# Patient Record
Sex: Female | Born: 1950 | Race: White | Hispanic: No | Smoking: Former smoker
Health system: Southern US, Community
[De-identification: ages and names within clinical notes are randomized; demographics above are authoritative.]

## PROBLEM LIST (undated history)

## (undated) DIAGNOSIS — I10 Essential (primary) hypertension: Secondary | ICD-10-CM

## (undated) DIAGNOSIS — D219 Benign neoplasm of connective and other soft tissue, unspecified: Secondary | ICD-10-CM

## (undated) DIAGNOSIS — R918 Other nonspecific abnormal finding of lung field: Secondary | ICD-10-CM

## (undated) DIAGNOSIS — C801 Malignant (primary) neoplasm, unspecified: Secondary | ICD-10-CM

## (undated) HISTORY — DX: Benign neoplasm of connective and other soft tissue, unspecified: D21.9

## (undated) HISTORY — DX: Essential (primary) hypertension: I10

## (undated) HISTORY — DX: Other nonspecific abnormal finding of lung field: R91.8

## (undated) HISTORY — PX: CARDIAC SURGERY: SHX584

## (undated) HISTORY — PX: CHOLECYSTECTOMY: SHX55

---

## 2001-04-14 ENCOUNTER — Emergency Department (HOSPITAL_COMMUNITY): Admission: EM | Admit: 2001-04-14 | Discharge: 2001-04-14 | Payer: Self-pay | Admitting: Emergency Medicine

## 2001-04-14 ENCOUNTER — Encounter: Payer: Self-pay | Admitting: Emergency Medicine

## 2001-04-14 ENCOUNTER — Encounter: Payer: Self-pay | Admitting: *Deleted

## 2001-12-27 ENCOUNTER — Inpatient Hospital Stay (HOSPITAL_COMMUNITY): Admission: EM | Admit: 2001-12-27 | Discharge: 2001-12-29 | Payer: Self-pay

## 2018-02-16 ENCOUNTER — Emergency Department (HOSPITAL_COMMUNITY): Payer: Medicare Other

## 2018-02-16 ENCOUNTER — Encounter (HOSPITAL_COMMUNITY): Payer: Self-pay | Admitting: *Deleted

## 2018-02-16 ENCOUNTER — Other Ambulatory Visit: Payer: Self-pay

## 2018-02-16 ENCOUNTER — Emergency Department (HOSPITAL_COMMUNITY)
Admission: EM | Admit: 2018-02-16 | Discharge: 2018-02-16 | Disposition: A | Discharge status: Home / Self Care | Payer: Medicare Other | Attending: Emergency Medicine | Admitting: Emergency Medicine

## 2018-02-16 DIAGNOSIS — D332 Benign neoplasm of brain, unspecified: Secondary | ICD-10-CM | POA: Diagnosis not present

## 2018-02-16 DIAGNOSIS — F1010 Alcohol abuse, uncomplicated: Secondary | ICD-10-CM | POA: Insufficient documentation

## 2018-02-16 DIAGNOSIS — Y9389 Activity, other specified: Secondary | ICD-10-CM | POA: Insufficient documentation

## 2018-02-16 DIAGNOSIS — Y998 Other external cause status: Secondary | ICD-10-CM | POA: Diagnosis not present

## 2018-02-16 DIAGNOSIS — R040 Epistaxis: Secondary | ICD-10-CM | POA: Diagnosis present

## 2018-02-16 DIAGNOSIS — Y929 Unspecified place or not applicable: Secondary | ICD-10-CM | POA: Insufficient documentation
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
## 2018-02-19 ENCOUNTER — Other Ambulatory Visit (HOSPITAL_COMMUNITY): Payer: Self-pay | Admitting: Pulmonary Disease

## 2018-02-19 DIAGNOSIS — R52 Pain, unspecified: Secondary | ICD-10-CM

## 2018-02-19 DIAGNOSIS — D496 Neoplasm of unspecified behavior of brain: Secondary | ICD-10-CM

## 2018-02-21 ENCOUNTER — Ambulatory Visit (HOSPITAL_COMMUNITY)
Admission: RE | Admit: 2018-02-21 | Discharge: 2018-02-21 | Disposition: A | Discharge status: Home / Self Care | Payer: Medicare Other | Source: Ambulatory Visit | Attending: Pulmonary Disease | Admitting: Pulmonary Disease

## 2018-02-21 DIAGNOSIS — J449 Chronic obstructive pulmonary disease, unspecified: Secondary | ICD-10-CM | POA: Diagnosis not present

## 2018-02-21 DIAGNOSIS — R52 Pain, unspecified: Secondary | ICD-10-CM

## 2018-02-21 DIAGNOSIS — D496 Neoplasm of unspecified behavior of brain: Secondary | ICD-10-CM | POA: Diagnosis not present

## 2018-02-21 DIAGNOSIS — J90 Pleural effusion, not elsewhere classified: Secondary | ICD-10-CM | POA: Diagnosis not present

## 2018-02-21 DIAGNOSIS — R918 Other nonspecific abnormal finding of lung field: Secondary | ICD-10-CM | POA: Insufficient documentation

## 2018-02-21 LAB — POCT I-STAT CREATININE: CREATININE: 0.8 mg/dL (ref 0.44–1.00)

## 2018-02-21 MED ORDER — GADOBUTROL 1 MMOL/ML IV SOLN
6.0000 mL | Freq: Once | INTRAVENOUS | Status: AC | PRN
  Administered 2018-02-21: 6 mL via INTRAVENOUS

## 2018-02-22 ENCOUNTER — Other Ambulatory Visit: Payer: Self-pay | Admitting: Radiation Therapy

## 2018-02-22 DIAGNOSIS — R918 Other nonspecific abnormal finding of lung field: Secondary | ICD-10-CM

## 2018-02-25 ENCOUNTER — Encounter (HOSPITAL_COMMUNITY): Payer: Self-pay | Admitting: Hematology

## 2018-02-25 ENCOUNTER — Other Ambulatory Visit: Payer: Self-pay

## 2018-02-25 ENCOUNTER — Inpatient Hospital Stay (HOSPITAL_COMMUNITY): Payer: Medicare Other | Attending: Hematology | Admitting: Hematology

## 2018-02-25 ENCOUNTER — Ambulatory Visit (HOSPITAL_COMMUNITY): Payer: Self-pay | Admitting: Hematology

## 2018-02-25 VITALS — BP 158/79 | HR 94 | Temp 98.6°F | Resp 18 | Ht 63.0 in | Wt 137.3 lb

## 2018-02-25 DIAGNOSIS — I10 Essential (primary) hypertension: Secondary | ICD-10-CM

## 2018-02-25 DIAGNOSIS — G9389 Other specified disorders of brain: Secondary | ICD-10-CM | POA: Diagnosis not present

## 2018-02-25 DIAGNOSIS — Z23 Encounter for immunization: Secondary | ICD-10-CM | POA: Insufficient documentation

## 2018-02-25 DIAGNOSIS — Z87891 Personal history of nicotine dependence: Secondary | ICD-10-CM | POA: Diagnosis not present

## 2018-02-25 DIAGNOSIS — R918 Other nonspecific abnormal finding of lung field: Secondary | ICD-10-CM | POA: Diagnosis not present

## 2018-02-25 DIAGNOSIS — Z79899 Other long term (current) drug therapy: Secondary | ICD-10-CM | POA: Insufficient documentation

## 2018-02-25 DIAGNOSIS — C7931 Secondary malignant neoplasm of brain: Secondary | ICD-10-CM | POA: Insufficient documentation
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
## 2018-02-25 NOTE — Patient Instructions (Signed)
[facility] Discharge Instructions     Thank you for choosing [facility] to provide your oncology and hematology care.  To afford each patient quality time with our provider, please arrive at least 15 minutes before your scheduled appointment time.   If you have a lab appointment with the Cancer Center please come in thru the  Main Entrance and check in at the main information desk  You need to re-schedule your appointment should you arrive 10 or more minutes late.  We strive to give you quality time with our providers, and arriving late affects you and other patients whose appointments are after yours.  Also, if you no show three or more times for appointments you may be dismissed from the clinic at the providers discretion.     Again, thank you for choosing [facility].  Our hope is that these requests will decrease the amount of time that you wait before being seen by our physicians.       _____________________________________________________________  Should you have questions after your visit to [facility], please contact our office at [phone] between the hours of 8:00 a.m. and 4:30 p.m.  Voicemails left after 4:00 p.m. will not be returned until the following business day.  For prescription refill requests, have your pharmacy contact our office and allow 72 hours.    Cancer Center Support Programs:   > Cancer Support Group  2nd Tuesday of the month 1pm-2pm, Journey Room    

## 2018-02-26 ENCOUNTER — Ambulatory Visit
Admission: RE | Admit: 2018-02-26 | Discharge: 2018-02-26 | Disposition: A | Discharge status: Home / Self Care | Payer: Medicare Other | Source: Ambulatory Visit | Attending: Radiation Oncology | Admitting: Radiation Oncology

## 2018-02-26 ENCOUNTER — Other Ambulatory Visit: Payer: Self-pay

## 2018-02-26 ENCOUNTER — Encounter: Payer: Self-pay | Admitting: Radiation Oncology

## 2018-02-26 ENCOUNTER — Ambulatory Visit (HOSPITAL_COMMUNITY)
Admission: RE | Admit: 2018-02-26 | Discharge: 2018-02-26 | Disposition: A | Discharge status: Home / Self Care | Payer: Medicare Other | Source: Ambulatory Visit | Attending: Radiation Oncology | Admitting: Radiation Oncology

## 2018-02-26 ENCOUNTER — Other Ambulatory Visit (HOSPITAL_COMMUNITY): Payer: Self-pay | Admitting: Nurse Practitioner

## 2018-02-26 VITALS — BP 156/92 | HR 102 | Temp 98.6°F | Resp 18 | Ht 63.0 in | Wt 139.4 lb

## 2018-02-26 DIAGNOSIS — C7931 Secondary malignant neoplasm of brain: Secondary | ICD-10-CM | POA: Insufficient documentation

## 2018-02-26 DIAGNOSIS — R188 Other ascites: Secondary | ICD-10-CM | POA: Diagnosis not present

## 2018-02-26 DIAGNOSIS — J90 Pleural effusion, not elsewhere classified: Secondary | ICD-10-CM | POA: Diagnosis not present

## 2018-02-26 DIAGNOSIS — R918 Other nonspecific abnormal finding of lung field: Secondary | ICD-10-CM | POA: Diagnosis present

## 2018-02-26 DIAGNOSIS — I10 Essential (primary) hypertension: Secondary | ICD-10-CM | POA: Insufficient documentation

## 2018-02-26 DIAGNOSIS — Z87891 Personal history of nicotine dependence: Secondary | ICD-10-CM | POA: Insufficient documentation

## 2018-02-26 MED ORDER — SODIUM CHLORIDE (PF) 0.9 % IJ SOLN
INTRAMUSCULAR | Status: AC
  Filled 2018-02-26: qty 50

## 2018-02-26 MED ORDER — IOHEXOL 300 MG/ML  SOLN
100.0000 mL | Freq: Once | INTRAMUSCULAR | Status: AC | PRN
  Administered 2018-02-26: 100 mL via INTRAVENOUS

## 2018-02-27 ENCOUNTER — Telehealth: Payer: Self-pay | Admitting: Radiation Oncology

## 2018-02-27 ENCOUNTER — Encounter: Payer: Self-pay | Admitting: Radiation Oncology

## 2018-02-27 ENCOUNTER — Telehealth (HOSPITAL_COMMUNITY): Payer: Self-pay | Admitting: Nurse Practitioner

## 2018-02-27 ENCOUNTER — Telehealth: Payer: Self-pay | Admitting: *Deleted

## 2018-02-27 NOTE — Telephone Encounter (Signed)
I called the patient's daughter and we discussed the results of the patient's scans and plans to move forward with PET and biopsy. She requested a letter for the patient's court date as her mom is being charged with a DUI. Letter was placed and also sent via mychart.
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
## 2018-02-27 NOTE — Telephone Encounter (Signed)
CALLED PATIENT TO INFORM OF PET SCAN FOR 03-04-18- ARRIVAL TIME -7:30 AM @ [facility], PT. TO BE NPO- 6 HRS. PRIOR TO TEST, SPOKE WITH PATIENT'S DAUGHTER - CAROLYN MCGUIRE AND SHE IS AWARE OF THIS TEST

## 2018-02-28 ENCOUNTER — Other Ambulatory Visit: Payer: Self-pay | Admitting: Radiation Oncology

## 2018-02-28 ENCOUNTER — Telehealth: Payer: Self-pay | Admitting: *Deleted

## 2018-02-28 ENCOUNTER — Ambulatory Visit (HOSPITAL_COMMUNITY): Payer: No Typology Code available for payment source

## 2018-02-28 ENCOUNTER — Other Ambulatory Visit: Payer: Self-pay | Admitting: *Deleted

## 2018-02-28 DIAGNOSIS — R918 Other nonspecific abnormal finding of lung field: Secondary | ICD-10-CM

## 2018-02-28 DIAGNOSIS — J90 Pleural effusion, not elsewhere classified: Secondary | ICD-10-CM

## 2018-02-28 NOTE — Telephone Encounter (Signed)
Spoke with Mrs. Jack Quarto regarding her mother Glenda Jensen.  I let her know that her mother's case was discussed in thoracic conference and she was cleared by Dr. Laurence Ferrari for [place] to proceed with getting her fluid drained and biopsied without having the PET done first.  I let her know to be expecting a phone call regarding this.  Will continue to follow as necessary.  Gloriajean Dell. Leonie Green, BSN

## 2018-02-28 NOTE — Progress Notes (Signed)
Cancer conference 02/28/18 discussion documented for communication purpose.  Patient was not in attendance nor seen by physician.

## 2018-03-03 ENCOUNTER — Other Ambulatory Visit: Payer: Self-pay | Admitting: Radiology

## 2018-03-04 ENCOUNTER — Ambulatory Visit (HOSPITAL_COMMUNITY)
Admission: RE | Admit: 2018-03-04 | Discharge: 2018-03-04 | Disposition: A | Discharge status: Home / Self Care | Payer: Medicare Other | Source: Ambulatory Visit | Attending: Radiation Oncology | Admitting: Radiation Oncology

## 2018-03-04 ENCOUNTER — Encounter: Payer: Self-pay | Admitting: Internal Medicine

## 2018-03-04 ENCOUNTER — Inpatient Hospital Stay: Payer: Medicare Other | Attending: Internal Medicine | Admitting: Internal Medicine

## 2018-03-04 ENCOUNTER — Other Ambulatory Visit: Payer: Self-pay | Admitting: Physician Assistant

## 2018-03-04 VITALS — BP 158/96 | HR 117 | Temp 97.6°F | Resp 16 | Ht 63.0 in | Wt 139.1 lb

## 2018-03-04 DIAGNOSIS — C782 Secondary malignant neoplasm of pleura: Secondary | ICD-10-CM | POA: Diagnosis not present

## 2018-03-04 DIAGNOSIS — C3431 Malignant neoplasm of lower lobe, right bronchus or lung: Secondary | ICD-10-CM | POA: Insufficient documentation

## 2018-03-04 DIAGNOSIS — I10 Essential (primary) hypertension: Secondary | ICD-10-CM | POA: Diagnosis not present

## 2018-03-04 DIAGNOSIS — Z79899 Other long term (current) drug therapy: Secondary | ICD-10-CM | POA: Insufficient documentation

## 2018-03-04 DIAGNOSIS — R918 Other nonspecific abnormal finding of lung field: Secondary | ICD-10-CM | POA: Diagnosis not present

## 2018-03-04 DIAGNOSIS — Z87891 Personal history of nicotine dependence: Secondary | ICD-10-CM | POA: Diagnosis not present

## 2018-03-04 DIAGNOSIS — C7931 Secondary malignant neoplasm of brain: Secondary | ICD-10-CM | POA: Diagnosis not present

## 2018-03-04 LAB — GLUCOSE, CAPILLARY: Glucose-Capillary: 142 mg/dL — ABNORMAL HIGH (ref 70–99)

## 2018-03-04 MED ORDER — FLUDEOXYGLUCOSE F - 18 (FDG) INJECTION
6.8100 | Freq: Once | INTRAVENOUS | Status: AC | PRN
  Administered 2018-03-04: 6.81 via INTRAVENOUS
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
## 2018-03-05 ENCOUNTER — Telehealth: Payer: Self-pay

## 2018-03-05 ENCOUNTER — Ambulatory Visit (HOSPITAL_COMMUNITY)
Admission: RE | Admit: 2018-03-05 | Discharge: 2018-03-05 | Disposition: A | Discharge status: Home / Self Care | Payer: Medicare Other | Source: Ambulatory Visit | Attending: Radiation Oncology | Admitting: Radiation Oncology

## 2018-03-05 ENCOUNTER — Encounter (HOSPITAL_COMMUNITY): Payer: Self-pay

## 2018-03-05 ENCOUNTER — Other Ambulatory Visit: Payer: Self-pay

## 2018-03-05 ENCOUNTER — Other Ambulatory Visit: Payer: Self-pay | Admitting: Radiation Oncology

## 2018-03-05 ENCOUNTER — Other Ambulatory Visit (HOSPITAL_COMMUNITY): Payer: Self-pay | Admitting: Radiation Oncology

## 2018-03-05 ENCOUNTER — Ambulatory Visit (HOSPITAL_COMMUNITY): Admission: RE | Admit: 2018-03-05 | Payer: Medicare Other | Source: Ambulatory Visit

## 2018-03-05 ENCOUNTER — Ambulatory Visit (HOSPITAL_COMMUNITY)
Admission: RE | Admit: 2018-03-05 | Discharge: 2018-03-05 | Disposition: A | Discharge status: Home / Self Care | Payer: Medicare Other | Source: Ambulatory Visit | Attending: Diagnostic Radiology | Admitting: Diagnostic Radiology

## 2018-03-05 DIAGNOSIS — J90 Pleural effusion, not elsewhere classified: Secondary | ICD-10-CM

## 2018-03-05 DIAGNOSIS — Z9889 Other specified postprocedural states: Secondary | ICD-10-CM | POA: Insufficient documentation

## 2018-03-05 DIAGNOSIS — R918 Other nonspecific abnormal finding of lung field: Secondary | ICD-10-CM

## 2018-03-05 HISTORY — PX: IR THORACENTESIS ASP PLEURAL SPACE W/IMG GUIDE: IMG5380

## 2018-03-05 LAB — CBC
HCT: 54.5 % — ABNORMAL HIGH (ref 36.0–46.0)
HEMOGLOBIN: 17.7 g/dL — AB (ref 12.0–15.0)
MCH: 29 pg (ref 26.0–34.0)
MCHC: 32.5 g/dL (ref 30.0–36.0)
MCV: 89.3 fL (ref 80.0–100.0)
NRBC: 0 % (ref 0.0–0.2)
PLATELETS: 340 10*3/uL (ref 150–400)
RBC: 6.1 MIL/uL — AB (ref 3.87–5.11)
RDW: 12.3 % (ref 11.5–15.5)
WBC: 12.6 10*3/uL — ABNORMAL HIGH (ref 4.0–10.5)

## 2018-03-05 LAB — PROTIME-INR
INR: 1.01
Prothrombin Time: 13.2 seconds (ref 11.4–15.2)

## 2018-03-05 MED ORDER — CEFAZOLIN SODIUM-DEXTROSE 2-4 GM/100ML-% IV SOLN
2.0000 g | Freq: Once | INTRAVENOUS | Status: AC
  Administered 2018-03-05: 2 g via INTRAVENOUS

## 2018-03-05 MED ORDER — CEFAZOLIN SODIUM-DEXTROSE 2-4 GM/100ML-% IV SOLN
INTRAVENOUS | Status: AC
  Filled 2018-03-05: qty 100

## 2018-03-05 MED ORDER — FENTANYL CITRATE (PF) 100 MCG/2ML IJ SOLN
INTRAMUSCULAR | Status: AC
  Filled 2018-03-05: qty 2

## 2018-03-05 MED ORDER — FENTANYL CITRATE (PF) 100 MCG/2ML IJ SOLN
INTRAMUSCULAR | Status: AC | PRN
  Administered 2018-03-05 (×2): 25 ug via INTRAVENOUS

## 2018-03-05 MED ORDER — MIDAZOLAM HCL 2 MG/2ML IJ SOLN
INTRAMUSCULAR | Status: AC | PRN
  Administered 2018-03-05 (×3): 0.5 mg via INTRAVENOUS

## 2018-03-05 MED ORDER — SODIUM CHLORIDE 0.9 % IV SOLN: INTRAVENOUS | Status: DC

## 2018-03-05 MED ORDER — HYDROCODONE-ACETAMINOPHEN 10-325 MG PO TABS
1.0000 | ORAL_TABLET | Freq: Once | ORAL | Status: AC
  Administered 2018-03-05: 1 via ORAL
  Filled 2018-03-05: qty 1

## 2018-03-05 MED ORDER — LIDOCAINE HCL 1 % IJ SOLN
INTRAMUSCULAR | Status: AC
  Filled 2018-03-05: qty 20

## 2018-03-05 MED ORDER — MIDAZOLAM HCL 2 MG/2ML IJ SOLN
INTRAMUSCULAR | Status: AC
  Filled 2018-03-05: qty 2

## 2018-03-05 NOTE — Telephone Encounter (Signed)
Per 11/25 no los

## 2018-03-05 NOTE — Progress Notes (Signed)
Difficulty taking deep breath, pt c/o pain 3/10 right chest just a bit more than when she arrived today. Dr Jeronimo Norma was called and updated, pain med was ordered.
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
## 2018-03-05 NOTE — Discharge Instructions (Signed)
Lung Biopsy, Care After °This sheet gives you information about how to care for yourself after your procedure. Your health care provider may also give you more specific instructions depending on the type of biopsy you had. If you have problems or questions, contact your health care provider. °What can I expect after the procedure? °After the procedure, it is common to have: °· A cough. °· A sore throat. °· Pain where a needle, bronchoscope, or incision was used to collect a biopsy sample (biopsy site). ° °Follow these instructions at home: °Medicines °· Take over-the-counter and prescription medicines only as told by your health care provider. °· Do not drive for 24 hours if you were given a sedative. °· Do not drink alcohol while taking pain medicine. °· Do not drive or use heavy machinery while taking prescription pain medicine. °· To prevent or treat constipation while you are taking prescription pain medicine, your health care provider may recommend that you: °? Drink enough fluid to keep your urine clear or pale yellow. °? Take over-the-counter or prescription medicines. °? Eat foods that are high in fiber, such as fresh fruits and vegetables, whole grains, and beans. °? Limit foods that are high in fat and processed sugars, such as fried and sweet foods. °Activity °· If you had an incision during your procedure, avoid activities that may pull the incision site open. °· Return to your normal activities as told by your health care provider. Ask your health care provider what activities are safe for you. °If you had an open biopsy:  °· Follow instructions from your health care provider about how to take care of your incision. Make sure you: °? Wash your hands with soap and water before you change your bandage (dressing). If soap and water are not available, use hand sanitizer. °? Change your dressing as told by your health care provider. °? Leave stitches (sutures), skin glue, or adhesive strips in place. These  skin closures may need to stay in place for 2 weeks or longer. If adhesive strip edges start to loosen and curl up, you may trim the loose edges. Do not remove adhesive strips completely unless your health care provider tells you to do that. °· Check your incision area every day for signs of infection. Check for: °? Redness, swelling, or pain. °? Fluid or blood. °? Warmth. °? Pus or a bad smell. °General instructions °· It is up to you to get the results of your procedure. Ask your health care provider, or the department that is doing the procedure, when your results will be ready. °Contact a health care provider if: °· You have a fever. °· You have redness, swelling, or pain around your biopsy site. °· You have fluid or blood coming from your biopsy site. °· Your biopsy site feels warm to the touch. °· You have pus or a bad smell coming from your biopsy site. °Get help right away if: °· You cough up blood. °· You have trouble breathing. °· You have chest pain. °Summary °· After the procedure, it is common to have a sore throat and a cough. °· Return to your normal activities as told by your health care provider. Ask your health care provider what activities are safe for you. °· Take over-the-counter and prescription medicines only as told by your health care provider. °· Report any unusual symptoms to your health care provider. °This information is not intended to replace advice given to you by your health care provider. Make sure   you discuss any questions you have with your health care provider. Document Released: 04/25/2016 Document Revised: 04/25/2016 Document Reviewed: 04/25/2016 Elsevier Interactive Patient Education  2018 [place]. Moderate Conscious Sedation, Adult, Care After These instructions provide you with information about caring for yourself after your procedure. Your health care provider may also give you more specific instructions. Your treatment has been planned according to current  medical practices, but problems sometimes occur. Call your health care provider if you have any problems or questions after your procedure. What can I expect after the procedure? After your procedure, it is common:  To feel sleepy for several hours.  To feel clumsy and have poor balance for several hours.  To have poor judgment for several hours.  To vomit if you eat too soon.  Follow these instructions at home: For at least 24 hours after the procedure:   Do not: ? Participate in activities where you could fall or become injured. ? Drive. ? Use heavy machinery. ? Drink alcohol. ? Take sleeping pills or medicines that cause drowsiness. ? Make important decisions or sign legal documents. ? Take care of children on your own.  Rest. Eating and drinking  Follow the diet recommended by your health care provider.  If you vomit: ? Drink water, juice, or soup when you can drink without vomiting. ? Make sure you have little or no nausea before eating solid foods. General instructions  Have a responsible adult stay with you until you are awake and alert.  Take over-the-counter and prescription medicines only as told by your health care provider.  If you smoke, do not smoke without supervision.  Keep all follow-up visits as told by your health care provider. This is important. Contact a health care provider if:  You keep feeling nauseous or you keep vomiting.  You feel light-headed.  You develop a rash.  You have a fever. Get help right away if:  You have trouble breathing. This information is not intended to replace advice given to you by your health care provider. Make sure you discuss any questions you have with your health care provider. Document Released: 01/15/2013 Document Revised: 08/30/2015 Document Reviewed: 07/17/2015 Elsevier Interactive Patient Education  [employer].

## 2018-03-05 NOTE — Procedures (Signed)
CT and US guided core biopsy of right pleural thickening, 4 cores obtained.  US guided right thoracentesis, removed 1.6 liters of amber fluid. Minimal blood loss and no immediate complication.   See full report in Imaging.

## 2018-03-06 ENCOUNTER — Other Ambulatory Visit: Payer: Self-pay

## 2018-03-06 ENCOUNTER — Inpatient Hospital Stay (HOSPITAL_BASED_OUTPATIENT_CLINIC_OR_DEPARTMENT_OTHER): Payer: Medicare Other | Admitting: Hematology

## 2018-03-06 ENCOUNTER — Encounter (HOSPITAL_COMMUNITY): Payer: Self-pay | Admitting: Hematology

## 2018-03-06 VITALS — BP 124/93 | HR 52 | Temp 97.6°F | Resp 20 | Wt 138.5 lb

## 2018-03-06 DIAGNOSIS — G9389 Other specified disorders of brain: Secondary | ICD-10-CM | POA: Diagnosis not present

## 2018-03-06 DIAGNOSIS — Z87891 Personal history of nicotine dependence: Secondary | ICD-10-CM

## 2018-03-06 DIAGNOSIS — R918 Other nonspecific abnormal finding of lung field: Secondary | ICD-10-CM

## 2018-03-06 DIAGNOSIS — I10 Essential (primary) hypertension: Secondary | ICD-10-CM

## 2018-03-06 DIAGNOSIS — C7931 Secondary malignant neoplasm of brain: Secondary | ICD-10-CM | POA: Diagnosis not present

## 2018-03-06 DIAGNOSIS — Z79899 Other long term (current) drug therapy: Secondary | ICD-10-CM

## 2018-03-06 MED ORDER — INFLUENZA VAC SPLIT HIGH-DOSE 0.5 ML IM SUSY
0.5000 mL | PREFILLED_SYRINGE | INTRAMUSCULAR | Status: AC
  Administered 2018-03-06: 0.5 mL via INTRAMUSCULAR
  Filled 2018-03-06: qty 0.5

## 2018-03-06 MED ORDER — HYDROCODONE-ACETAMINOPHEN 5-325 MG PO TABS: 1.0000 | ORAL_TABLET | Freq: Three times a day (TID) | ORAL | 0 refills | Status: DC | PRN

## 2018-03-06 MED ORDER — HYDROCODONE-ACETAMINOPHEN 5-325 MG PO TABS
1.0000 | ORAL_TABLET | ORAL | Status: AC
  Administered 2018-03-06: 1 via ORAL
  Filled 2018-03-06: qty 1
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
## 2018-03-06 NOTE — Patient Instructions (Addendum)
[place] at [facility] Discharge Instructions  Follow up with [place] in 2 weeks   Thank you for choosing [place] at [facility] to provide your oncology and hematology care.  To afford each patient quality time with our provider, please arrive at least 15 minutes before your scheduled appointment time.   If you have a lab appointment with the [facility] please come in thru the  Main Entrance and check in at the main information desk  You need to re-schedule your appointment should you arrive 10 or more minutes late.  We strive to give you quality time with our providers, and arriving late affects you and other patients whose appointments are after yours.  Also, if you no show three or more times for appointments you may be dismissed from the clinic at the providers discretion.     Again, thank you for choosing [facility].  Our hope is that these requests will decrease the amount of time that you wait before being seen by our physicians.       _____________________________________________________________  Should you have questions after your visit to [facility], please contact our office at [phone] between the hours of 8:00 a.m. and 4:30 p.m.  Voicemails left after 4:00 p.m. will not be returned until the following business day.  For prescription refill requests, have your pharmacy contact our office and allow 72 hours.    Cancer Center Support Programs:   > Cancer Support Group  2nd Tuesday of the month 1pm-2pm, Journey Room

## 2018-03-11 ENCOUNTER — Encounter: Payer: Self-pay | Admitting: Radiation Oncology

## 2018-03-12 ENCOUNTER — Ambulatory Visit (INDEPENDENT_AMBULATORY_CARE_PROVIDER_SITE_OTHER): Payer: Medicare Other | Admitting: General Surgery

## 2018-03-12 ENCOUNTER — Telehealth (HOSPITAL_COMMUNITY): Payer: Self-pay | Admitting: Surgery

## 2018-03-12 ENCOUNTER — Encounter: Payer: Self-pay | Admitting: General Surgery

## 2018-03-12 VITALS — BP 151/88 | HR 111 | Temp 96.4°F | Resp 20 | Wt 142.0 lb

## 2018-03-12 DIAGNOSIS — C7931 Secondary malignant neoplasm of brain: Secondary | ICD-10-CM | POA: Diagnosis not present

## 2018-03-12 NOTE — H&P (Signed)
Glenda Jensen; [id number]; 1950/08/23   HPI Patient is a 67 year old white female who was referred to my care by Dr. Delton Coombes for Port-A-Cath placement.  She has metastatic cancer to the brain and is about to undergo chemotherapy.  She currently has a pain level 5 out of 10 throughout her body.  She recently had a right lung biopsy. Past Medical History:  Diagnosis Date  . Fibroids   . Hypertension   . Lung mass     Past Surgical History:  Procedure Laterality Date  . CARDIAC SURGERY     at age 3  . CHOLECYSTECTOMY    . IR THORACENTESIS ASP PLEURAL SPACE W/IMG GUIDE  03/05/2018    Family History  Problem Relation Age of Onset  . Diabetes Mother   . Dementia Mother   . Hypertension Father   . Cancer Maternal Uncle        in eye  . Heart disease Maternal Grandfather   . Colon cancer Maternal Uncle     Current Outpatient Medications on File Prior to Visit  Medication Sig Dispense Refill  . acetaminophen (TYLENOL) 500 MG tablet Take 500 mg by mouth every 6 (six) hours as needed for moderate pain or headache.     . diltiazem (DILACOR XR) 240 MG 24 hr capsule Take 240 mg by mouth daily.    Marland Kitchen docusate sodium (COLACE) 100 MG capsule Take 100 mg by mouth 2 (two) times daily as needed for mild constipation.     Marland Kitchen HYDROcodone-acetaminophen (NORCO) 5-325 MG tablet Take 1 tablet by mouth every 8 (eight) hours as needed for moderate pain. 30 tablet 0  . senna (SENOKOT) 8.6 MG TABS tablet Take 1 tablet by mouth daily as needed for moderate constipation.      No current facility-administered medications on file prior to visit.     No Known Allergies  Social History   Substance and Sexual Activity  Alcohol Use Yes    Social History   Tobacco Use  Smoking Status Former Smoker  . Packs/day: 0.50  . Years: 3.00  . Pack years: 1.50  . Types: Cigarettes  . Last attempt to quit: 02/18/2018  . Years since quitting: 0.0  Smokeless Tobacco Never Used    Review of Systems   Constitutional: Negative.   HENT: Negative.   Respiratory: Positive for cough.   Cardiovascular: Negative.   Gastrointestinal: Negative.   Genitourinary: Negative.   Musculoskeletal: Positive for back pain.  Skin: Negative.   Neurological: Negative.   Endo/Heme/Allergies: Negative.   Psychiatric/Behavioral: Negative.     Objective   Vitals:   03/12/18 1309  BP: (!) 151/88  Pulse: (!) 111  Resp: 20  Temp: (!) 96.4 F (35.8 C)    Physical Exam  Constitutional: She is oriented to person, place, and time. She appears well-developed and well-nourished. No distress.  HENT:  Head: Normocephalic and atraumatic.  Cardiovascular: Normal rate, regular rhythm and normal heart sounds. Exam reveals no gallop and no friction rub.  No murmur heard. Pulmonary/Chest: Effort normal and breath sounds normal. No stridor. No respiratory distress. She has no wheezes. She has no rales.  Neurological: She is alert and oriented to person, place, and time.  Skin: Skin is warm and dry.  Vitals reviewed. Oncology notes reviewed  Assessment  Metastatic cancer to the brain Plan   Patient is scheduled for Port-A-Cath insertion on 03/30/2018.  The risks and benefits of the procedure including bleeding, infection, and pneumothorax were fully explained to the patient,  who gave informed consent.

## 2018-03-12 NOTE — Patient Instructions (Signed)
Implanted Port Insertion Implanted port insertion is a procedure to put in a port and catheter. The port is a device with an injectable disk that can be accessed by your health care provider. The port is connected to a vein in the chest or neck by a small flexible tube (catheter). There are different types of ports. The implanted port may be used as a long-term IV access for:  Medicines, such as chemotherapy.  Fluids.  Liquid nutrition, such as total parenteral nutrition (TPN).  Blood samples.  Having a port means that your health care provider will not need to use the veins in your arms for these procedures. Tell a health care provider about:  Any allergies you have.  All medicines you are taking, especially blood thinners, as well as any vitamins, herbs, eye drops, creams, over-the-counter medicines, and steroids.  Any problems you or family members have had with anesthetic medicines.  Any blood disorders you have.  Any surgeries you have had.  Any medical conditions you have, including diabetes or kidney problems.  Whether you are pregnant or may be pregnant. What are the risks? Generally, this is a safe procedure. However, problems may occur, including:  Allergic reactions to medicines or dyes.  Damage to other structures or organs.  Infection.  Damage to the blood vessel, bruising, or bleeding at the puncture site.  Blood clot.  Breakdown of the skin over the port.  A collection of air in the chest that can cause one of the lungs to collapse (pneumothorax). This is rare.  Medicines  Ask your health care provider about: ? Changing or stopping your regular medicines. This is especially important if you are taking diabetes medicines or blood thinners. ? Taking medicines such as aspirin and ibuprofen. These medicines can thin your blood. Do not take these medicines before your procedure if your health care provider instructs you not to.  You may be given antibiotic  medicine to help prevent infection. General instructions  Plan to have someone take you home from the hospital or clinic.  If you will be going home right after the procedure, plan to have someone with you for 24 hours.  You may have blood tests.  You may be asked to shower with a germ-killing soap. What happens during the procedure?  To lower your risk of infection: ? Your health care team will wash or sanitize their hands. ? Your skin will be washed with soap. ? Hair may be removed from the surgical area.  An IV tube will be inserted into one of your veins.  You will be given one or more of the following: ? A medicine to help you relax (sedative). ? A medicine to numb the area (local anesthetic).  Two small cuts (incisions) will be made to insert the port. ? One incision will be made in your neck to get access to the vein where the catheter will lie. ? The other incision will be made in the upper chest. This is where the port will lie.  The procedure may be done using continuous X-ray (fluoroscopy) or other imaging tools for guidance.  The port and catheter will be placed. There may be a small, raised area where the port is.  The port will be flushed with a salt solution (saline), and blood will be drawn to make sure that it is working correctly.  The incisions will be closed.  Bandages (dressings) may be placed over the incisions. The procedure may vary among health care  providers and hospitals. What happens after the procedure?  Your blood pressure, heart rate, breathing rate, and blood oxygen level will be monitored until the medicines you were given have worn off.  Do not drive for 24 hours if you were given a sedative.  You will be given a manufacturer's information card for the type of port that you have. Keep this with you.  Your port will need to be flushed and checked as told by your health care provider, usually every few weeks.  A chest X-ray will be done  to: ? Check the placement of the port. ? Make sure there is no injury to your lung. Summary  Implanted port insertion is a procedure to put in a port and catheter.  The implanted port is used as a long-term IV access.  The port will need to be flushed and checked as told by your health care provider, usually every few weeks.  Keep your manufacturer's information card with you at all times. This information is not intended to replace advice given to you by your health care provider. Make sure you discuss any questions you have with your health care provider. Document Released: 01/15/2013 Document Revised: 02/16/2016 Document Reviewed: 02/16/2016 Elsevier Interactive Patient Education  2017 [employer].

## 2018-03-12 NOTE — Telephone Encounter (Signed)
Pt's daughter left message saying pt was in a great amount of pain, even with the pt taking the Vicodin that was prescribed.  I spoke to the pt's daughter, and she said the pt had to have an extra half tablet today not longer after taking the first tablet.  Per pt's daughter, the pain is in her rt flank and also an intermittent sharp pain across her abdomen.  Told daughter that I would check with Dr. Raliegh Ip and Lala Lund and give her a call back after I have spoken with them.

## 2018-03-12 NOTE — Progress Notes (Signed)
Glenda Jensen; [id number]; 15-Feb-1951   HPI Patient is a 67 year old white female who was referred to my care by Dr. Delton Coombes for Port-A-Cath placement.  She has metastatic cancer to the brain and is about to undergo chemotherapy.  She currently has a pain level 5 out of 10 throughout her body.  She recently had a right lung biopsy. Past Medical History:  Diagnosis Date  . Fibroids   . Hypertension   . Lung mass     Past Surgical History:  Procedure Laterality Date  . CARDIAC SURGERY     at age 50  . CHOLECYSTECTOMY    . IR THORACENTESIS ASP PLEURAL SPACE W/IMG GUIDE  03/05/2018    Family History  Problem Relation Age of Onset  . Diabetes Mother   . Dementia Mother   . Hypertension Father   . Cancer Maternal Uncle        in eye  . Heart disease Maternal Grandfather   . Colon cancer Maternal Uncle     Current Outpatient Medications on File Prior to Visit  Medication Sig Dispense Refill  . acetaminophen (TYLENOL) 500 MG tablet Take 500 mg by mouth every 6 (six) hours as needed for moderate pain or headache.     . diltiazem (DILACOR XR) 240 MG 24 hr capsule Take 240 mg by mouth daily.    Marland Kitchen docusate sodium (COLACE) 100 MG capsule Take 100 mg by mouth 2 (two) times daily as needed for mild constipation.     Marland Kitchen HYDROcodone-acetaminophen (NORCO) 5-325 MG tablet Take 1 tablet by mouth every 8 (eight) hours as needed for moderate pain. 30 tablet 0  . senna (SENOKOT) 8.6 MG TABS tablet Take 1 tablet by mouth daily as needed for moderate constipation.      No current facility-administered medications on file prior to visit.     No Known Allergies  Social History   Substance and Sexual Activity  Alcohol Use Yes    Social History   Tobacco Use  Smoking Status Former Smoker  . Packs/day: 0.50  . Years: 3.00  . Pack years: 1.50  . Types: Cigarettes  . Last attempt to quit: 02/18/2018  . Years since quitting: 0.0  Smokeless Tobacco Never Used    Review of Systems   Constitutional: Negative.   HENT: Negative.   Respiratory: Positive for cough.   Cardiovascular: Negative.   Gastrointestinal: Negative.   Genitourinary: Negative.   Musculoskeletal: Positive for back pain.  Skin: Negative.   Neurological: Negative.   Endo/Heme/Allergies: Negative.   Psychiatric/Behavioral: Negative.     Objective   Vitals:   03/12/18 1309  BP: (!) 151/88  Pulse: (!) 111  Resp: 20  Temp: (!) 96.4 F (35.8 C)    Physical Exam  Constitutional: She is oriented to person, place, and time. She appears well-developed and well-nourished. No distress.  HENT:  Head: Normocephalic and atraumatic.  Cardiovascular: Normal rate, regular rhythm and normal heart sounds. Exam reveals no gallop and no friction rub.  No murmur heard. Pulmonary/Chest: Effort normal and breath sounds normal. No stridor. No respiratory distress. She has no wheezes. She has no rales.  Neurological: She is alert and oriented to person, place, and time.  Skin: Skin is warm and dry.  Vitals reviewed. Oncology notes reviewed  Assessment  Metastatic cancer to the brain Plan   Patient is scheduled for Port-A-Cath insertion on 03/11/2018.  The risks and benefits of the procedure including bleeding, infection, and pneumothorax were fully explained to the patient,  who gave informed consent.

## 2018-03-13 ENCOUNTER — Other Ambulatory Visit (HOSPITAL_COMMUNITY): Payer: Self-pay | Admitting: Nurse Practitioner

## 2018-03-13 ENCOUNTER — Other Ambulatory Visit: Payer: Self-pay | Admitting: Radiation Therapy

## 2018-03-13 ENCOUNTER — Telehealth: Payer: Self-pay | Admitting: Radiation Oncology

## 2018-03-13 ENCOUNTER — Telehealth (HOSPITAL_COMMUNITY): Payer: Self-pay | Admitting: *Deleted

## 2018-03-13 ENCOUNTER — Other Ambulatory Visit: Payer: Self-pay | Admitting: Radiation Oncology

## 2018-03-13 DIAGNOSIS — C7949 Secondary malignant neoplasm of other parts of nervous system: Principal | ICD-10-CM

## 2018-03-13 DIAGNOSIS — R918 Other nonspecific abnormal finding of lung field: Secondary | ICD-10-CM

## 2018-03-13 DIAGNOSIS — C7931 Secondary malignant neoplasm of brain: Secondary | ICD-10-CM

## 2018-03-13 MED ORDER — HYDROCODONE-ACETAMINOPHEN 5-325 MG PO TABS: 1.0000 | ORAL_TABLET | Freq: Three times a day (TID) | ORAL | 0 refills | Status: AC | PRN

## 2018-03-13 MED ORDER — OXYCODONE HCL 10 MG PO TABS: 10.0000 mg | ORAL_TABLET | Freq: Four times a day (QID) | ORAL | 0 refills | Status: AC | PRN

## 2018-03-13 NOTE — Telephone Encounter (Signed)
Advised the pt's daughter to up her pain medication to two tablets every 8 hours as needed for pain. And also advised her to keep the pt's appointment tomorrow. The daughter stated that her mother is in a great deal of pain and that she needs her pain medication sooner than every 8 hours. She also stated that her mother only has 4 tablets left to get her through today. I advised Hoyle Sauer, daughter, that I would send this note back to Dr. Delton Coombes and see what he would do.

## 2018-03-13 NOTE — Telephone Encounter (Signed)
I spoke with Hoyle Sauer, the patient's daughter to share preliminary path results. We will move forward with [facility] treatment planning. The patient did not have any known injections in the left shoulder/humerus. We will follow her course with this expectantly. She will see Dr. Delton Coombes tomorrow. She has had significant pain in her chest wall since her thoracentesis and is waiting to hear back for some additional pain medication from Dr. Marthann Schiller office. If she hasn't heard back by the end of the day I let her know to call us back and we could refill it.

## 2018-03-13 NOTE — Telephone Encounter (Signed)
-----   Message from Glennie Isle, NP-C sent at 03/12/2018  4:04 PM EST ----- Let them know to keep their app on Thursday and if she needs it she can take 2 pills for severe pain and we will address this further on Thursday.  She should have plenty of pills I prescribed 30  ----- Message ----- From: Farley Ly, LPN Sent: [id number]   2:30 PM EST To: Derek Jack, MD, Glennie Isle, NP-C  Pt's daughter called stating that the pt is still in severe pain, pain is in her right flank and lower abdomen. Pt's daughter had to give the pt an extra half of a pain pill after the giving 1 tablet due to the pain.   Is there something else that can be given for pain?

## 2018-03-14 ENCOUNTER — Encounter (HOSPITAL_COMMUNITY): Payer: Self-pay | Admitting: Hematology

## 2018-03-14 ENCOUNTER — Other Ambulatory Visit: Payer: Self-pay

## 2018-03-14 ENCOUNTER — Encounter (HOSPITAL_COMMUNITY): Payer: Self-pay

## 2018-03-14 ENCOUNTER — Inpatient Hospital Stay (HOSPITAL_BASED_OUTPATIENT_CLINIC_OR_DEPARTMENT_OTHER): Payer: Medicare Other | Admitting: Hematology

## 2018-03-14 ENCOUNTER — Inpatient Hospital Stay (HOSPITAL_COMMUNITY): Payer: Medicare Other

## 2018-03-14 VITALS — BP 144/91 | HR 127 | Temp 99.2°F | Resp 16 | Wt 144.3 lb

## 2018-03-14 DIAGNOSIS — Z87891 Personal history of nicotine dependence: Secondary | ICD-10-CM | POA: Insufficient documentation

## 2018-03-14 DIAGNOSIS — R1011 Right upper quadrant pain: Secondary | ICD-10-CM

## 2018-03-14 DIAGNOSIS — R918 Other nonspecific abnormal finding of lung field: Secondary | ICD-10-CM

## 2018-03-14 DIAGNOSIS — Z79899 Other long term (current) drug therapy: Secondary | ICD-10-CM

## 2018-03-14 DIAGNOSIS — C801 Malignant (primary) neoplasm, unspecified: Secondary | ICD-10-CM

## 2018-03-14 DIAGNOSIS — A419 Sepsis, unspecified organism: Secondary | ICD-10-CM | POA: Diagnosis not present

## 2018-03-14 DIAGNOSIS — C7931 Secondary malignant neoplasm of brain: Secondary | ICD-10-CM

## 2018-03-14 DIAGNOSIS — M7989 Other specified soft tissue disorders: Secondary | ICD-10-CM

## 2018-03-14 DIAGNOSIS — R600 Localized edema: Secondary | ICD-10-CM

## 2018-03-14 DIAGNOSIS — I10 Essential (primary) hypertension: Secondary | ICD-10-CM

## 2018-03-14 DIAGNOSIS — R112 Nausea with vomiting, unspecified: Secondary | ICD-10-CM

## 2018-03-14 DIAGNOSIS — R410 Disorientation, unspecified: Secondary | ICD-10-CM | POA: Diagnosis not present

## 2018-03-14 LAB — CBC WITH DIFFERENTIAL/PLATELET
Abs Immature Granulocytes: 0.43 10*3/uL — ABNORMAL HIGH (ref 0.00–0.07)
Basophils Absolute: 0.1 10*3/uL (ref 0.0–0.1)
Basophils Relative: 1 %
Eosinophils Absolute: 0 10*3/uL (ref 0.0–0.5)
Eosinophils Relative: 0 %
HCT: 49.2 % — ABNORMAL HIGH (ref 36.0–46.0)
Hemoglobin: 16.5 g/dL — ABNORMAL HIGH (ref 12.0–15.0)
IMMATURE GRANULOCYTES: 2 %
LYMPHS ABS: 0.8 10*3/uL (ref 0.7–4.0)
Lymphocytes Relative: 4 %
MCH: 29.7 pg (ref 26.0–34.0)
MCHC: 33.5 g/dL (ref 30.0–36.0)
MCV: 88.5 fL (ref 80.0–100.0)
MONOS PCT: 9 %
Monocytes Absolute: 1.6 10*3/uL — ABNORMAL HIGH (ref 0.1–1.0)
NEUTROS PCT: 84 %
Neutro Abs: 15.2 10*3/uL — ABNORMAL HIGH (ref 1.7–7.7)
PLATELETS: 354 10*3/uL (ref 150–400)
RBC: 5.56 MIL/uL — ABNORMAL HIGH (ref 3.87–5.11)
RDW: 12.2 % (ref 11.5–15.5)
WBC: 18.2 10*3/uL — ABNORMAL HIGH (ref 4.0–10.5)
nRBC: 0 % (ref 0.0–0.2)

## 2018-03-14 LAB — COMPREHENSIVE METABOLIC PANEL
ALT: 11 U/L (ref 0–44)
ANION GAP: 14 (ref 5–15)
AST: 17 U/L (ref 15–41)
Albumin: 2.6 g/dL — ABNORMAL LOW (ref 3.5–5.0)
Alkaline Phosphatase: 237 U/L — ABNORMAL HIGH (ref 38–126)
BILIRUBIN TOTAL: 0.6 mg/dL (ref 0.3–1.2)
BUN: 10 mg/dL (ref 8–23)
CALCIUM: 9.3 mg/dL (ref 8.9–10.3)
CO2: 25 mmol/L (ref 22–32)
Chloride: 84 mmol/L — ABNORMAL LOW (ref 98–111)
Creatinine, Ser: 0.68 mg/dL (ref 0.44–1.00)
GLUCOSE: 237 mg/dL — AB (ref 70–99)
Potassium: 3.7 mmol/L (ref 3.5–5.1)
Sodium: 123 mmol/L — ABNORMAL LOW (ref 135–145)
TOTAL PROTEIN: 6.7 g/dL (ref 6.5–8.1)

## 2018-03-14 MED ORDER — GABAPENTIN 100 MG PO CAPS: 100.0000 mg | ORAL_CAPSULE | Freq: Two times a day (BID) | ORAL | 0 refills | Status: AC

## 2018-03-14 MED ORDER — FUROSEMIDE 20 MG PO TABS: 20.0000 mg | ORAL_TABLET | Freq: Every day | ORAL | 1 refills | Status: AC

## 2018-03-14 MED ORDER — PROCHLORPERAZINE MALEATE 10 MG PO TABS: 10.0000 mg | ORAL_TABLET | Freq: Four times a day (QID) | ORAL | 0 refills | Status: AC | PRN
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
## 2018-03-14 NOTE — Patient Instructions (Signed)
[facility] at [facility] Discharge Instructions     Thank you for choosing [facility] at [facility] to provide your oncology and hematology care.  To afford each patient quality time with our provider, please arrive at least 15 minutes before your scheduled appointment time.   If you have a lab appointment with the Cancer Center please come in thru the  Main Entrance and check in at the main information desk  You need to re-schedule your appointment should you arrive 10 or more minutes late.  We strive to give you quality time with our providers, and arriving late affects you and other patients whose appointments are after yours.  Also, if you no show three or more times for appointments you may be dismissed from the clinic at the providers discretion.     Again, thank you for choosing [facility].  Our hope is that these requests will decrease the amount of time that you wait before being seen by our physicians.       _____________________________________________________________  Should you have questions after your visit to [facility], please contact our office at [phone] between the hours of 8:00 a.m. and 4:30 p.m.  Voicemails left after 4:00 p.m. will not be returned until the following business day.  For prescription refill requests, have your pharmacy contact our office and allow 72 hours.    Cancer Center Support Programs:   > Cancer Support Group  2nd Tuesday of the month 1pm-2pm, Journey Room    
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
## 2018-03-15 ENCOUNTER — Encounter (HOSPITAL_COMMUNITY)
Admission: RE | Admit: 2018-03-15 | Discharge: 2018-03-15 | Disposition: A | Discharge status: Home / Self Care | Payer: Medicare Other | Source: Ambulatory Visit | Attending: General Surgery | Admitting: General Surgery

## 2018-03-17 ENCOUNTER — Encounter (HOSPITAL_COMMUNITY): Payer: Self-pay | Admitting: Emergency Medicine

## 2018-03-17 ENCOUNTER — Other Ambulatory Visit: Payer: Self-pay

## 2018-03-17 ENCOUNTER — Other Ambulatory Visit (HOSPITAL_COMMUNITY): Payer: Self-pay

## 2018-03-17 ENCOUNTER — Emergency Department (HOSPITAL_COMMUNITY): Payer: Medicare Other

## 2018-03-17 ENCOUNTER — Encounter (HOSPITAL_COMMUNITY): Payer: Self-pay | Admitting: Anesthesiology

## 2018-03-17 ENCOUNTER — Inpatient Hospital Stay (HOSPITAL_COMMUNITY)
Admission: EM | Admit: 2018-03-17 | Discharge: 2018-04-10 | DRG: 870 | Disposition: E | Payer: Medicare Other | Attending: Emergency Medicine | Admitting: Emergency Medicine

## 2018-03-17 DIAGNOSIS — Z938 Other artificial opening status: Secondary | ICD-10-CM

## 2018-03-17 DIAGNOSIS — C3431 Malignant neoplasm of lower lobe, right bronchus or lung: Secondary | ICD-10-CM | POA: Diagnosis present

## 2018-03-17 DIAGNOSIS — R6520 Severe sepsis without septic shock: Secondary | ICD-10-CM

## 2018-03-17 DIAGNOSIS — I361 Nonrheumatic tricuspid (valve) insufficiency: Secondary | ICD-10-CM | POA: Diagnosis not present

## 2018-03-17 DIAGNOSIS — I340 Nonrheumatic mitral (valve) insufficiency: Secondary | ICD-10-CM | POA: Diagnosis not present

## 2018-03-17 DIAGNOSIS — E875 Hyperkalemia: Secondary | ICD-10-CM | POA: Diagnosis not present

## 2018-03-17 DIAGNOSIS — E871 Hypo-osmolality and hyponatremia: Secondary | ICD-10-CM

## 2018-03-17 DIAGNOSIS — C3491 Malignant neoplasm of unspecified part of right bronchus or lung: Secondary | ICD-10-CM | POA: Diagnosis not present

## 2018-03-17 DIAGNOSIS — Z978 Presence of other specified devices: Secondary | ICD-10-CM

## 2018-03-17 DIAGNOSIS — I119 Hypertensive heart disease without heart failure: Secondary | ICD-10-CM | POA: Diagnosis present

## 2018-03-17 DIAGNOSIS — Z515 Encounter for palliative care: Secondary | ICD-10-CM | POA: Diagnosis present

## 2018-03-17 DIAGNOSIS — C7931 Secondary malignant neoplasm of brain: Secondary | ICD-10-CM | POA: Diagnosis present

## 2018-03-17 DIAGNOSIS — K567 Ileus, unspecified: Secondary | ICD-10-CM | POA: Diagnosis not present

## 2018-03-17 DIAGNOSIS — R410 Disorientation, unspecified: Secondary | ICD-10-CM

## 2018-03-17 DIAGNOSIS — J9601 Acute respiratory failure with hypoxia: Secondary | ICD-10-CM

## 2018-03-17 DIAGNOSIS — E162 Hypoglycemia, unspecified: Secondary | ICD-10-CM | POA: Diagnosis not present

## 2018-03-17 DIAGNOSIS — J9690 Respiratory failure, unspecified, unspecified whether with hypoxia or hypercapnia: Secondary | ICD-10-CM

## 2018-03-17 DIAGNOSIS — Z6831 Body mass index (BMI) 31.0-31.9, adult: Secondary | ICD-10-CM | POA: Diagnosis not present

## 2018-03-17 DIAGNOSIS — A419 Sepsis, unspecified organism: Principal | ICD-10-CM

## 2018-03-17 DIAGNOSIS — Z8249 Family history of ischemic heart disease and other diseases of the circulatory system: Secondary | ICD-10-CM | POA: Diagnosis not present

## 2018-03-17 DIAGNOSIS — Z79891 Long term (current) use of opiate analgesic: Secondary | ICD-10-CM | POA: Diagnosis not present

## 2018-03-17 DIAGNOSIS — J690 Pneumonitis due to inhalation of food and vomit: Secondary | ICD-10-CM | POA: Diagnosis present

## 2018-03-17 DIAGNOSIS — J90 Pleural effusion, not elsewhere classified: Secondary | ICD-10-CM | POA: Diagnosis present

## 2018-03-17 DIAGNOSIS — J189 Pneumonia, unspecified organism: Secondary | ICD-10-CM | POA: Diagnosis present

## 2018-03-17 DIAGNOSIS — Z66 Do not resuscitate: Secondary | ICD-10-CM | POA: Diagnosis present

## 2018-03-17 DIAGNOSIS — E872 Acidosis: Secondary | ICD-10-CM | POA: Diagnosis not present

## 2018-03-17 DIAGNOSIS — J80 Acute respiratory distress syndrome: Secondary | ICD-10-CM | POA: Diagnosis present

## 2018-03-17 DIAGNOSIS — R6521 Severe sepsis with septic shock: Secondary | ICD-10-CM | POA: Diagnosis present

## 2018-03-17 DIAGNOSIS — N170 Acute kidney failure with tubular necrosis: Secondary | ICD-10-CM | POA: Diagnosis not present

## 2018-03-17 DIAGNOSIS — C3490 Malignant neoplasm of unspecified part of unspecified bronchus or lung: Secondary | ICD-10-CM

## 2018-03-17 DIAGNOSIS — E669 Obesity, unspecified: Secondary | ICD-10-CM | POA: Diagnosis not present

## 2018-03-17 DIAGNOSIS — G9341 Metabolic encephalopathy: Secondary | ICD-10-CM | POA: Diagnosis present

## 2018-03-17 DIAGNOSIS — Z87891 Personal history of nicotine dependence: Secondary | ICD-10-CM | POA: Diagnosis not present

## 2018-03-17 DIAGNOSIS — N179 Acute kidney failure, unspecified: Secondary | ICD-10-CM | POA: Diagnosis not present

## 2018-03-17 DIAGNOSIS — J9602 Acute respiratory failure with hypercapnia: Secondary | ICD-10-CM | POA: Diagnosis not present

## 2018-03-17 DIAGNOSIS — E222 Syndrome of inappropriate secretion of antidiuretic hormone: Secondary | ICD-10-CM | POA: Diagnosis present

## 2018-03-17 DIAGNOSIS — Z79899 Other long term (current) drug therapy: Secondary | ICD-10-CM | POA: Diagnosis not present

## 2018-03-17 DIAGNOSIS — Z4659 Encounter for fitting and adjustment of other gastrointestinal appliance and device: Secondary | ICD-10-CM

## 2018-03-17 DIAGNOSIS — E8729 Other acidosis: Secondary | ICD-10-CM | POA: Diagnosis not present

## 2018-03-17 DIAGNOSIS — R0902 Hypoxemia: Secondary | ICD-10-CM | POA: Diagnosis not present

## 2018-03-17 DIAGNOSIS — J910 Malignant pleural effusion: Secondary | ICD-10-CM | POA: Diagnosis present

## 2018-03-17 HISTORY — DX: Malignant (primary) neoplasm, unspecified: C80.1

## 2018-03-17 LAB — URINALYSIS, ROUTINE W REFLEX MICROSCOPIC
Bilirubin Urine: NEGATIVE
Glucose, UA: NEGATIVE mg/dL
KETONES UR: 5 mg/dL — AB
Leukocytes, UA: NEGATIVE
Nitrite: NEGATIVE
Protein, ur: 30 mg/dL — AB
Specific Gravity, Urine: 1.029 (ref 1.005–1.030)
pH: 5 (ref 5.0–8.0)

## 2018-03-17 LAB — BLOOD GAS, ARTERIAL
Acid-Base Excess: 5 mmol/L — ABNORMAL HIGH (ref 0.0–2.0)
Acid-Base Excess: 6.8 mmol/L — ABNORMAL HIGH (ref 0.0–2.0)
Bicarbonate: 29.4 mmol/L — ABNORMAL HIGH (ref 20.0–28.0)
Bicarbonate: 30.1 mmol/L — ABNORMAL HIGH (ref 20.0–28.0)
Delivery systems: POSITIVE
Drawn by: 221791
Drawn by: 362771
Expiratory PAP: 6
FIO2: 60
Inspiratory PAP: 12
O2 Content: 6 L/min
O2 Saturation: 88.9 %
O2 Saturation: 89.4 %
PCO2 ART: 42.4 mmHg (ref 32.0–48.0)
Patient temperature: 98.6
pCO2 arterial: 46.3 mmHg (ref 32.0–48.0)
pH, Arterial: 7.418 (ref 7.350–7.450)
pH, Arterial: 7.472 — ABNORMAL HIGH (ref 7.350–7.450)
pO2, Arterial: 58.4 mmHg — ABNORMAL LOW (ref 83.0–108.0)
pO2, Arterial: 60.1 mmHg — ABNORMAL LOW (ref 83.0–108.0)

## 2018-03-17 LAB — CBC WITH DIFFERENTIAL/PLATELET
Abs Immature Granulocytes: 0.39 10*3/uL — ABNORMAL HIGH (ref 0.00–0.07)
Basophils Absolute: 0.1 10*3/uL (ref 0.0–0.1)
Basophils Relative: 0 %
Eosinophils Absolute: 0 10*3/uL (ref 0.0–0.5)
Eosinophils Relative: 0 %
HEMATOCRIT: 47.4 % — AB (ref 36.0–46.0)
Hemoglobin: 16.2 g/dL — ABNORMAL HIGH (ref 12.0–15.0)
Immature Granulocytes: 1 %
Lymphocytes Relative: 2 %
Lymphs Abs: 0.5 10*3/uL — ABNORMAL LOW (ref 0.7–4.0)
MCH: 29.1 pg (ref 26.0–34.0)
MCHC: 34.2 g/dL (ref 30.0–36.0)
MCV: 85.1 fL (ref 80.0–100.0)
MONOS PCT: 6 %
Monocytes Absolute: 1.7 10*3/uL — ABNORMAL HIGH (ref 0.1–1.0)
Neutro Abs: 27.6 10*3/uL — ABNORMAL HIGH (ref 1.7–7.7)
Neutrophils Relative %: 91 %
Platelets: 366 10*3/uL (ref 150–400)
RBC: 5.57 MIL/uL — ABNORMAL HIGH (ref 3.87–5.11)
RDW: 12 % (ref 11.5–15.5)
WBC: 30.3 10*3/uL — ABNORMAL HIGH (ref 4.0–10.5)
nRBC: 0 % (ref 0.0–0.2)

## 2018-03-17 LAB — COMPREHENSIVE METABOLIC PANEL
ALT: 11 U/L (ref 0–44)
AST: 21 U/L (ref 15–41)
Albumin: 2.6 g/dL — ABNORMAL LOW (ref 3.5–5.0)
Alkaline Phosphatase: 262 U/L — ABNORMAL HIGH (ref 38–126)
Anion gap: 14 (ref 5–15)
BUN: 12 mg/dL (ref 8–23)
CALCIUM: 10.2 mg/dL (ref 8.9–10.3)
CO2: 28 mmol/L (ref 22–32)
Chloride: 76 mmol/L — ABNORMAL LOW (ref 98–111)
Creatinine, Ser: 0.53 mg/dL (ref 0.44–1.00)
GFR calc Af Amer: >60 mL/min (ref >60)
GFR calc non Af Amer: >60 mL/min (ref >60)
Glucose, Bld: 194 mg/dL — ABNORMAL HIGH (ref 70–99)
Potassium: 4.7 mmol/L (ref 3.5–5.1)
Sodium: 118 mmol/L — CL (ref 135–145)
Total Bilirubin: 0.6 mg/dL (ref 0.3–1.2)
Total Protein: 6.6 g/dL (ref 6.5–8.1)

## 2018-03-17 LAB — LACTIC ACID, PLASMA
Lactic Acid, Venous: 3.6 mmol/L (ref 0.5–1.9)
Lactic Acid, Venous: 2.8 mmol/L (ref 0.5–1.9)

## 2018-03-17 LAB — BRAIN NATRIURETIC PEPTIDE: B Natriuretic Peptide: 457 pg/mL — ABNORMAL HIGH (ref 0.0–100.0)

## 2018-03-17 LAB — LIPASE, BLOOD: Lipase: 35 U/L (ref 11–51)

## 2018-03-17 LAB — PROCALCITONIN: Procalcitonin: 1.14 ng/mL

## 2018-03-17 LAB — TROPONIN I: Troponin I: <0.03 ng/mL (ref <0.03)

## 2018-03-17 LAB — MAGNESIUM: Magnesium: 1.8 mg/dL (ref 1.7–2.4)

## 2018-03-17 LAB — PROTIME-INR
INR: 1.1
Prothrombin Time: 14.1 seconds (ref 11.4–15.2)

## 2018-03-17 LAB — SODIUM, URINE, RANDOM: Sodium, Ur: <10 mmol/L

## 2018-03-17 MED ORDER — LORAZEPAM 2 MG/ML IJ SOLN
1.0000 mg | Freq: Once | INTRAMUSCULAR | Status: AC
  Administered 2018-03-17: 1 mg via INTRAVENOUS
  Filled 2018-03-17: qty 1

## 2018-03-17 MED ORDER — SODIUM CHLORIDE 0.9 % IV SOLN
2.0000 g | Freq: Three times a day (TID) | INTRAVENOUS | Status: DC
  Administered 2018-03-18 – 2018-03-20 (×6): 2 g via INTRAVENOUS
  Filled 2018-03-17 (×8): qty 2

## 2018-03-17 MED ORDER — SODIUM CHLORIDE 0.9 % IV SOLN
2.0000 g | Freq: Once | INTRAVENOUS | Status: AC
  Administered 2018-03-17: 2 g via INTRAVENOUS
  Filled 2018-03-17: qty 2

## 2018-03-17 MED ORDER — SODIUM CHLORIDE 0.9 % IV BOLUS
500.0000 mL | Freq: Once | INTRAVENOUS | Status: AC
  Administered 2018-03-17: 500 mL via INTRAVENOUS

## 2018-03-17 MED ORDER — SODIUM CHLORIDE 0.9 % IV SOLN
INTRAVENOUS | Status: DC
  Administered 2018-03-17: 16:00:00 via INTRAVENOUS

## 2018-03-17 MED ORDER — VANCOMYCIN HCL IN DEXTROSE 1-5 GM/200ML-% IV SOLN
1000.0000 mg | Freq: Once | INTRAVENOUS | Status: AC
  Administered 2018-03-17: 1000 mg via INTRAVENOUS
  Filled 2018-03-17: qty 200

## 2018-03-17 MED ORDER — METRONIDAZOLE IN NACL 5-0.79 MG/ML-% IV SOLN
500.0000 mg | Freq: Three times a day (TID) | INTRAVENOUS | Status: DC
  Administered 2018-03-17 – 2018-03-21 (×11): 500 mg via INTRAVENOUS
  Filled 2018-03-17 (×12): qty 100

## 2018-03-17 MED ORDER — LORAZEPAM 2 MG/ML IJ SOLN
1.0000 mg | Freq: Four times a day (QID) | INTRAMUSCULAR | Status: DC | PRN
  Administered 2018-03-17 – 2018-03-19 (×3): 1 mg via INTRAVENOUS
  Filled 2018-03-17 (×2): qty 1

## 2018-03-17 MED ORDER — ENOXAPARIN SODIUM 40 MG/0.4ML ~~LOC~~ SOLN
40.0000 mg | SUBCUTANEOUS | Status: DC
  Administered 2018-03-17 – 2018-03-22 (×6): 40 mg via SUBCUTANEOUS
  Filled 2018-03-17 (×7): qty 0.4

## 2018-03-17 MED ORDER — VANCOMYCIN HCL IN DEXTROSE 750-5 MG/150ML-% IV SOLN
750.0000 mg | Freq: Two times a day (BID) | INTRAVENOUS | Status: DC
  Administered 2018-03-18 – 2018-03-20 (×5): 750 mg via INTRAVENOUS
  Filled 2018-03-17 (×6): qty 150

## 2018-03-17 MED ORDER — LORAZEPAM 2 MG/ML IJ SOLN: 1.0000 mg | Freq: Once | INTRAMUSCULAR | Status: DC

## 2018-03-17 MED ORDER — LORAZEPAM 2 MG/ML IJ SOLN
INTRAMUSCULAR | Status: AC
  Filled 2018-03-17: qty 1

## 2018-03-17 MED ORDER — FENTANYL CITRATE (PF) 100 MCG/2ML IJ SOLN
50.0000 ug | Freq: Once | INTRAMUSCULAR | Status: AC
  Administered 2018-03-17: 50 ug via INTRAVENOUS
  Filled 2018-03-17: qty 2

## 2018-03-17 MED ORDER — HALOPERIDOL LACTATE 5 MG/ML IJ SOLN
2.0000 mg | Freq: Once | INTRAMUSCULAR | Status: AC
  Administered 2018-03-17: 2 mg via INTRAVENOUS
  Filled 2018-03-17: qty 1

## 2018-03-17 NOTE — ED Notes (Signed)
CRITICAL VALUE ALERT  Critical Value:  Sodium 118  Date & Time Notied:  03/14/2018 1615  Provider Notified: dr Rogene Houston  Orders Received/Actions taken:

## 2018-03-17 NOTE — ED Notes (Signed)
Date and time results received: 03/30/2018 5:11 PM  (use smartphrase ".now" to insert current time)  Test: Lactic Critical Value: 3.6  Name of Provider Notified: Zackowski  Orders Received? Or Actions Taken?: Orders Received - See Orders for details

## 2018-03-17 NOTE — ED Notes (Signed)
Report to [facility].  

## 2018-03-17 NOTE — ED Notes (Signed)
To CT  via stretcher  Resp with

## 2018-03-17 NOTE — ED Triage Notes (Signed)
Pt recently was seen for a large pleural effusion and had drained.  Pt has been having increasing shortness of breath for the past few days.  Breath sounds significantly decreased on right side.  Pt has been taking oxycodone instead of hydrocodone since Friday due to right sided back pain.  Sats 70s on room air and unable to tolerate mask.  Does not normally wear oxygen.

## 2018-03-17 NOTE — ED Notes (Signed)
Report to [place], [place]

## 2018-03-17 NOTE — ED Notes (Signed)
CRITICAL LACTIC ACID CALL FROM lab  Current lactic is 2.8  Dr Earnest Conroy informed

## 2018-03-17 NOTE — ED Notes (Signed)
Daughters at bedside

## 2018-03-17 NOTE — ED Notes (Signed)
Daughters to hall to discuss findings with Dr Earnest Conroy  Hold on CT due to need of B pap

## 2018-03-17 NOTE — ED Notes (Addendum)
Pt is dyspneic, orthopneic and confused   She reports, "I probably am dying cause I can feel it" Daughters to bedside and apprised of thus far test as well as physician name  Per daughter, pt is scheduled for a port a cath tomorrow

## 2018-03-17 NOTE — ED Notes (Signed)
To CT

## 2018-03-17 NOTE — Progress Notes (Signed)
Called lab, they are running the urine sodium now.

## 2018-03-17 NOTE — Progress Notes (Addendum)
Patient with significant agitation.  Given 1mg  ativan.  Tachycardia to 140s, satting mid 80s on BIPAP.  RRT increased BIPAP to 100%, now satting low 90s though still has some agitation.  RR in mid to upper 30s, tachycardia 140.  Dr. Rogene [place] reviewed patient, is going to try a small amount of fentanyl to see if this helps, he does not feel she requires intubation at the moment though.  I do feel that patient seems very high risk to require intubation later tonight.  Gave physician to physician sign out on this critically ill patient to my partner Dr. Octavia Bruckner Opyd down at [facility].

## 2018-03-17 NOTE — ED Notes (Signed)
Bladder Scan 8ml

## 2018-03-17 NOTE — Progress Notes (Addendum)
Called to the bedside by the RN with concerns of increased agitation and hypoxia. On assessment, pt is agitated, cyanotic and extremely confused. Pt is tachypneic and tachycardiac. Sats in low 90's on bipap. Pt slightly hypertensive. Lung sounds clear but diminished at bases. Talked with family at bedside, pt status unchanged from earlier before relocation from [facility]. Pt continues to try and remove bipap despite constant redirection and being given 3mg  total of Ativan and 2mg  of Haldol.   Acute resp failure with hypoxia  - Repeat ABG has actually improved from previous one done at 3:30pm. Continue with Bipap. Give pt additional dose of Ativan 2mg  IV.    Lovey Newcomer, NP [facility]  7p-7a [phone]  Called to the bedside a second time by RN. Pt continues to decline. Spoke with family regarding POC for pt. Pt will remain a full code for now. Contacted CCM for further management.

## 2018-03-17 NOTE — ED Notes (Signed)
BiPAP settings   15/6,60%, RR 16

## 2018-03-17 NOTE — ED Notes (Signed)
Youngest daughter to bedside
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
## 2018-03-17 NOTE — Progress Notes (Signed)
Pharmacy Antibiotic Note  Glenda Jensen is a 67 y.o. female admitted on 03/15/2018 with sepsis.  Pharmacy has been consulted for cefepime and vancomycin dosing.  Plan: Vancomycin 750mg  IV every 12 hours.  Goal trough 15-20 mcg/mL. cefepime 2gm iv q8h  Height: 5\' 4"  (162.6 cm) Weight: 143 lb 4.8 oz (65 kg) IBW/kg (Calculated) : 54.7  Temp (24hrs), Avg:97.9 F (36.6 C), Min:97.6 F (36.4 C), Max:98.2 F (36.8 C)  Recent Labs  Lab 03/14/18 1016 04/04/2018 1532  WBC 18.2* 30.3*  CREATININE 0.68 0.53    Estimated Creatinine Clearance: 59.7 mL/min (by C-G formula based on SCr of 0.53 mg/dL).    No Known Allergies  Antimicrobials this admission: 12/8 vancomycin >> 12/8 cefepime >> 12/8 metronidazole >>  Microbiology results: 12/8 BCx: sent   Thank you for allowing pharmacy to be a part of this patient's care.  Donna Christen Katelynd Blauvelt 04/02/2018 4:25 PM

## 2018-03-17 NOTE — H&P (Addendum)
History and Physical    Glenda Jensen MCN:[id number] DOB: 1951-02-21 DOA: 04/07/2018  PCP: Sinda Du, MD  Patient coming from: Home  I have personally briefly reviewed patient's old medical records in [place]  Chief Complaint: CP  HPI: Glenda Jensen is a 67 y.o. 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  R plerual effusion s/p 1.6L thoracentesis at end of Nov.  Patient presents to ED with confusion, increasing SOB over past few days.  Has been taking oxycodone since Fri due to R sided back pain.  Per family: no complaints of headache, abd pain.  Has been having R sided back pain since the time of thoracentesis per family.   ED Course: Patient noted to be dyspnic with O2 sats in the 70s.  Improved on BIPAP.  ABG shows hypoxic resp failure without hypercapnea.  BMP shows sodium 118 down from 123 on 12/5.  Lactate 3.6, given 500cc NS bolus, repeat 2.8.  Tachycardic to 140s initially, now down to 115 after BIPAP and bolus.  BPs running 62-947 systolic.  No UOP yet.  Procalcitonin 1.14.  WBC 30k.  CXR shows large R pleural effusion, increased Insterstitial markings on L.   Review of Systems: Unable to perform due to AMS.  Past Medical History:  Diagnosis Date  . Cancer ([place])   . Fibroids   . Hypertension   . Lung mass     Past Surgical History:  Procedure Laterality Date  . CARDIAC SURGERY     at age 30  . CHOLECYSTECTOMY    . IR THORACENTESIS ASP PLEURAL SPACE W/IMG GUIDE  03/05/2018     reports that she quit smoking about 3 weeks ago. Her smoking use included cigarettes. She has a 1.50 pack-year smoking history. She has never used smokeless tobacco. She reports that she drinks alcohol. She reports that she has current or past drug history.  No Known Allergies  Family History  Problem Relation Age of Onset  . Diabetes Mother   . Dementia Mother   . Hypertension Father   . Cancer Maternal Uncle        in eye  . Heart  disease Maternal Grandfather   . Colon cancer Maternal Uncle      Prior to Admission medications   Medication Sig Start Date End Date Taking? Authorizing Provider  diltiazem (DILACOR XR) 240 MG 24 hr capsule Take 240 mg by mouth every morning.    Yes [provider]  furosemide (LASIX) 20 MG tablet Take 1 tablet (20 mg total) by mouth daily. 03/14/18  Yes Lockamy, Randi L, NP-C  gabapentin (NEURONTIN) 100 MG capsule Take 1 capsule (100 mg total) by mouth 2 (two) times daily. Patient taking differently: Take 100 mg by mouth at bedtime.  03/14/18  Yes Lockamy, Randi L, NP-C  Misc Natural Products (GINSENG COMPLEX PO) Take 2 capsules by mouth 2 (two) times daily.   Yes [provider]  OVER THE COUNTER MEDICATION Take 1 mL by mouth 3 (three) times daily as needed (for pain). Hemp Oil   Yes [provider]  oxyCODONE 10 MG TABS Take 1 tablet (10 mg total) by mouth every 6 (six) hours as needed for severe pain. 03/13/18  Yes Lockamy, Randi L, NP-C  polyethylene glycol powder (GLYCOLAX/MIRALAX) powder Take 17 g by mouth daily as needed for mild constipation or moderate constipation.   Yes [provider]  prochlorperazine (COMPAZINE) 10 MG tablet Take 1 tablet (  10 mg total) by mouth every 6 (six) hours as needed for nausea or vomiting. 03/14/18  Yes Lockamy, Randi L, NP-C  HYDROcodone-acetaminophen (NORCO) 5-325 MG tablet Take 1 tablet by mouth every 8 (eight) hours as needed for moderate pain. Patient not taking: Reported on 03/14/2018 03/13/18   Hayden Pedro, [place]    Physical Exam: Vitals:   04/09/2018 1745 03/15/2018 1800 03/27/2018 1830 03/30/2018 1900  BP:  98/71 101/78 91/67  Pulse: (!) 124 (!) 121 (!) 117 (!) 116  Resp: (!) 25 (!) 25 (!) 30 (!) 28  Temp:      TempSrc:      SpO2: (!) 87% (!) 89% 90% (!) 89%  Weight:      Height:        Constitutional: Pt with confusion, on BIPAP Eyes: PERRL, lids and conjunctivae normal ENMT: Mucous membranes are  moist. Posterior pharynx clear of any exudate or lesions.Normal dentition.  Neck: normal, supple, no masses, no thyromegaly Respiratory: Absent on R, clear on L Cardiovascular: Tachycardic, no pulsus paradoxus, no peripheral edema Abdomen: no tenderness, no masses palpated. No hepatosplenomegaly. Bowel sounds positive.  Musculoskeletal: no clubbing / cyanosis. No joint deformity upper and lower extremities. Good ROM, no contractures. Normal muscle tone.   Skin: no rashes, lesions, ulcers. No induration Neurologic: MAE, grossly non-focal but patient is confused. Psychiatric: Confused   Labs on Admission: I have personally reviewed following labs and imaging studies  CBC: Recent Labs  Lab 03/14/18 1016 03/15/2018 1532  WBC 18.2* 30.3*  NEUTROABS 15.2* 27.6*  HGB 16.5* 16.2*  HCT 49.2* 47.4*  MCV 88.5 85.1  PLT 354 474   Basic Metabolic Panel: Recent Labs  Lab 03/14/18 1016 04/06/2018 1458 03/25/2018 1532  NA 123*  --  118*  K 3.7  --  4.7  CL 84*  --  76*  CO2 25  --  28  GLUCOSE 237*  --  194*  BUN 10  --  12  CREATININE 0.68  --  0.53  CALCIUM 9.3  --  10.2  MG  --  1.8  --    GFR: Estimated Creatinine Clearance: 59.7 mL/min (by C-G formula based on SCr of 0.53 mg/dL). Liver Function Tests: Recent Labs  Lab 03/14/18 1016 03/21/2018 1532  AST 17 21  ALT 11 11  ALKPHOS 237* 262*  BILITOT 0.6 0.6  PROT 6.7 6.6  ALBUMIN 2.6* 2.6*   Recent Labs  Lab 04/01/2018 1532  LIPASE 35   No results for input(s): AMMONIA in the last 168 hours. Coagulation Profile: Recent Labs  Lab 04/03/2018 1532  INR 1.10   Cardiac Enzymes: No results for input(s): CKTOTAL, CKMB, CKMBINDEX, TROPONINI in the last 168 hours. BNP (last 3 results) No results for input(s): PROBNP in the last 8760 hours. HbA1C: No results for input(s): HGBA1C in the last 72 hours. CBG: No results for input(s): GLUCAP in the last 168 hours. Lipid Profile: No results for input(s): CHOL, HDL, LDLCALC, TRIG,  CHOLHDL, LDLDIRECT in the last 72 hours. Thyroid Function Tests: No results for input(s): TSH, T4TOTAL, FREET4, T3FREE, THYROIDAB in the last 72 hours. Anemia Panel: No results for input(s): VITAMINB12, FOLATE, FERRITIN, TIBC, IRON, RETICCTPCT in the last 72 hours. Urine analysis: No results found for: COLORURINE, APPEARANCEUR, [place], [place], GLUCOSEU, [place], BILIRUBINUR, KETONESUR, PROTEINUR, UROBILINOGEN, NITRITE, LEUKOCYTESUR  Radiological Exams on Admission: Ct Head Wo Contrast  Result Date: 03/27/2018 CLINICAL DATA:  Altered mental status. EXAM: CT HEAD WITHOUT CONTRAST TECHNIQUE: Contiguous axial images were obtained  from the base of the skull through the vertex without intravenous contrast. COMPARISON:  Brain MRI 02/21/2018. FINDINGS: Brain: Focal area of decreased attenuation identified in the right posterior temporal lobe (axial image 16/series 2 and coronal image 43/series 4. There is fairly substantial motion artifact on this exam and features may be related to artifact but a small focus of acute to subacute ischemia is not excluded. The right down this lesion is again identified with abnormal soft tissue in the posterior aspect of the right lateral ventricle. No hydrocephalus. No evidence for an acute hemorrhage. No abnormal extra-axial fluid collection. Stable nonacute left cerebellar infarct. Vascular: No hyperdense vessel or unexpected calcification. Skull: No evidence for fracture. No worrisome lytic or sclerotic lesion. Sinuses/Orbits: The visualized paranasal sinuses and mastoid air cells are clear. Visualized portions of the globes and intraorbital fat are unremarkable. Other: None. IMPRESSION: 1. Motion degraded study. Small area of hypo attenuation in the posterior right temporal lobe may be related to motion artifact although a small acute to subacute area of ischemia cannot be excluded. MRI of the brain could be used to further evaluate as clinically warranted. 2. The mass in the  right thalamus is again noted with the abnormal soft tissue extending into the posterior aspect of the right lateral ventricle. Electronically Signed   By: Misty Stanley M.D.   On: 03/29/2018 18:04   Dg Chest Portable 1 View  Result Date: 04/03/2018 CLINICAL DATA:  Respiratory some distress.  Right lung cancer. EXAM: PORTABLE CHEST 1 VIEW COMPARISON:  03/05/2018 FINDINGS: Right pleural effusion persists, moderate to large in size. There is diffuse interstitial and basilar airspace disease bilaterally with some obscuration by motion artifact. No left pleural effusion. The cardio pericardial silhouette is enlarged. The visualized bony structures of the thorax are intact. IMPRESSION: 1. Cardiomegaly with moderate to large right pleural effusion. 2. Diffuse interstitial and basilar predominant airspace opacity. Pulmonary edema a distinct consideration although diffuse infection not excluded. Electronically Signed   By: Misty Stanley M.D.   On: 03/16/2018 16:02    EKG: Independently reviewed.  Assessment/Plan Principal Problem:   Acute respiratory failure with hypoxia (HCC) Active Problems:   Brain metastasis (HCC)   Non-small cell lung cancer (NSCLC) (HCC)   Hyponatremia with decreased serum osmolality   Severe sepsis with acute organ dysfunction (HCC)   Recurrent pleural effusion on right   Acute metabolic encephalopathy    1. Acute resp failure with hypoxia - 1. Recurrent pleural effusion and possibly PNA too 2. Rescue BIPAP 3. IR thoracentesis in AM, if decompensates further may require chest tube tonight. 4. Admitting to [facility] for above 5. Also treating as PNA 2. Severe sepsis - 1. Giving a second 500cc NS bolus 2. NS at 75 for the moment 3. Really want to see that urine sodium though to R/O SIADH, but no UOP yet 1. Getting bladder scan 4. Empiric cefepime, vanc, flagyl 5. Presumably PNA at this point given the new onset hypoxia and respiratory distress and CXR findings 3. Recurrent  R pleural effusion - 1. Large 2. IR thoracentesis ASAP in AM with labs ordered 4. Hyponatremia - 1. Dehydration vs SIADH 2. UA, urine sodium, urine osm ordered stat 3. BMP Q6H 5. Acute encephalopathy - 1. Due to hyponatremia, sepsis 2. Ativan 44m Q6H PRN agitation  DVT prophylaxis: Lovenox Code Status: Full Family Communication: Family at bedside Disposition Plan: TBD Consults called: none Admission status: Admit to inpatient  Severity of Illness: The appropriate patient status for this  patient is INPATIENT. Inpatient status is judged to be reasonable and necessary in order to provide the required intensity of service to ensure the patient's safety. The patient's presenting symptoms, physical exam findings, and initial radiographic and laboratory data in the context of their chronic comorbidities is felt to place them at high risk for further clinical deterioration. Furthermore, it is not anticipated that the patient will be medically stable for discharge from the hospital within 2 midnights of admission. The following factors support the patient status of inpatient.   " The patient's presenting symptoms include confusion, dyspnea. " The worrisome physical exam findings include AMS, resp distress, absent breath sounds on R side. " The initial radiographic and laboratory data are worrisome because of Large R pleural effusion, hypoxia requiring rescue BIPAP, WBC 30k with procalcitonin, lactate 3.6, Sodium 118!. " The chronic co-morbidities include NSCLC with brain met.   * I certify that at the point of admission it is my clinical judgment that the patient will require inpatient hospital care spanning beyond 2 midnights from the point of admission due to high intensity of service, high risk for further deterioration and high frequency of surveillance required.Etta Quill DO [facility] Pager [phone] Only works nights!  If 7AM-7PM, please contact the primary day  team physician taking care of patient  [email or web address] Password [email or web address]  03/28/2018, 8:10 PM

## 2018-03-17 NOTE — ED Notes (Signed)
Purewick placed on pt. 

## 2018-03-17 NOTE — ED Notes (Signed)
Hospitalist in to assess

## 2018-03-18 ENCOUNTER — Inpatient Hospital Stay (HOSPITAL_COMMUNITY): Payer: Medicare Other

## 2018-03-18 ENCOUNTER — Other Ambulatory Visit: Payer: Medicare Other

## 2018-03-18 ENCOUNTER — Encounter (HOSPITAL_COMMUNITY): Admission: EM | Disposition: E | Payer: Self-pay | Source: Home / Self Care | Attending: Emergency Medicine

## 2018-03-18 ENCOUNTER — Ambulatory Visit (HOSPITAL_COMMUNITY): Admission: RE | Admit: 2018-03-18 | Payer: Medicare Other | Source: Ambulatory Visit | Admitting: General Surgery

## 2018-03-18 DIAGNOSIS — C3491 Malignant neoplasm of unspecified part of right bronchus or lung: Secondary | ICD-10-CM

## 2018-03-18 DIAGNOSIS — C7931 Secondary malignant neoplasm of brain: Secondary | ICD-10-CM

## 2018-03-18 DIAGNOSIS — I340 Nonrheumatic mitral (valve) insufficiency: Secondary | ICD-10-CM

## 2018-03-18 DIAGNOSIS — I361 Nonrheumatic tricuspid (valve) insufficiency: Secondary | ICD-10-CM

## 2018-03-18 DIAGNOSIS — J9601 Acute respiratory failure with hypoxia: Secondary | ICD-10-CM

## 2018-03-18 DIAGNOSIS — J80 Acute respiratory distress syndrome: Secondary | ICD-10-CM | POA: Diagnosis present

## 2018-03-18 DIAGNOSIS — E871 Hypo-osmolality and hyponatremia: Secondary | ICD-10-CM

## 2018-03-18 DIAGNOSIS — J9602 Acute respiratory failure with hypercapnia: Secondary | ICD-10-CM

## 2018-03-18 DIAGNOSIS — G9341 Metabolic encephalopathy: Secondary | ICD-10-CM

## 2018-03-18 LAB — POCT I-STAT 3, ART BLOOD GAS (G3+)
Acid-Base Excess: 6 mmol/L — ABNORMAL HIGH (ref 0.0–2.0)
Acid-Base Excess: 4 mmol/L — ABNORMAL HIGH (ref 0.0–2.0)
BICARBONATE: 29.6 mmol/L — AB (ref 20.0–28.0)
Bicarbonate: 31.1 mmol/L — ABNORMAL HIGH (ref 20.0–28.0)
O2 SAT: 93 %
O2 Saturation: 87 %
PO2 ART: 55 mmHg — AB (ref 83.0–108.0)
TCO2: 31 mmol/L (ref 22–32)
TCO2: 33 mmol/L — ABNORMAL HIGH (ref 22–32)
pCO2 arterial: 39.1 mmHg (ref 32.0–48.0)
pCO2 arterial: 53.1 mmHg — ABNORMAL HIGH (ref 32.0–48.0)
pH, Arterial: 7.487 — ABNORMAL HIGH (ref 7.350–7.450)
pH, Arterial: 7.375 (ref 7.350–7.450)
pO2, Arterial: 63 mmHg — ABNORMAL LOW (ref 83.0–108.0)

## 2018-03-18 LAB — GLUCOSE, CAPILLARY
Glucose-Capillary: 123 mg/dL — ABNORMAL HIGH (ref 70–99)
Glucose-Capillary: 108 mg/dL — ABNORMAL HIGH (ref 70–99)
Glucose-Capillary: 93 mg/dL (ref 70–99)
Glucose-Capillary: 83 mg/dL (ref 70–99)
Glucose-Capillary: 85 mg/dL (ref 70–99)

## 2018-03-18 LAB — BASIC METABOLIC PANEL
Anion gap: 18 — ABNORMAL HIGH (ref 5–15)
Anion gap: 13 (ref 5–15)
Anion gap: 12 (ref 5–15)
BUN: 12 mg/dL (ref 8–23)
BUN: 12 mg/dL (ref 8–23)
BUN: 14 mg/dL (ref 8–23)
CO2: 25 mmol/L (ref 22–32)
CO2: 30 mmol/L (ref 22–32)
CO2: 27 mmol/L (ref 22–32)
CREATININE: 0.31 mg/dL — AB (ref 0.44–1.00)
Calcium: 10.2 mg/dL (ref 8.9–10.3)
Calcium: 9.5 mg/dL (ref 8.9–10.3)
Calcium: 9.2 mg/dL (ref 8.9–10.3)
Chloride: 79 mmol/L — ABNORMAL LOW (ref 98–111)
Chloride: 79 mmol/L — ABNORMAL LOW (ref 98–111)
Chloride: 84 mmol/L — ABNORMAL LOW (ref 98–111)
Creatinine, Ser: 0.7 mg/dL (ref 0.44–1.00)
Creatinine, Ser: 0.67 mg/dL (ref 0.44–1.00)
GFR calc Af Amer: >60 mL/min (ref >60)
GFR calc Af Amer: >60 mL/min (ref >60)
GFR calc Af Amer: >60 mL/min (ref >60)
GFR calc non Af Amer: >60 mL/min (ref >60)
GFR calc non Af Amer: >60 mL/min (ref >60)
GFR calc non Af Amer: >60 mL/min (ref >60)
Glucose, Bld: 139 mg/dL — ABNORMAL HIGH (ref 70–99)
Glucose, Bld: 154 mg/dL — ABNORMAL HIGH (ref 70–99)
Glucose, Bld: 101 mg/dL — ABNORMAL HIGH (ref 70–99)
POTASSIUM: 4.6 mmol/L (ref 3.5–5.1)
Potassium: 4.8 mmol/L (ref 3.5–5.1)
Potassium: 4.8 mmol/L (ref 3.5–5.1)
SODIUM: 123 mmol/L — AB (ref 135–145)
Sodium: 122 mmol/L — ABNORMAL LOW (ref 135–145)
Sodium: 122 mmol/L — ABNORMAL LOW (ref 135–145)

## 2018-03-18 LAB — CBC
HCT: 39.9 % (ref 36.0–46.0)
Hemoglobin: 13.2 g/dL (ref 12.0–15.0)
MCH: 29.4 pg (ref 26.0–34.0)
MCHC: 33.1 g/dL (ref 30.0–36.0)
MCV: 88.9 fL (ref 80.0–100.0)
PLATELETS: 361 10*3/uL (ref 150–400)
RBC: 4.49 MIL/uL (ref 3.87–5.11)
RDW: 12.1 % (ref 11.5–15.5)
WBC: 26.3 10*3/uL — ABNORMAL HIGH (ref 4.0–10.5)
nRBC: 0 % (ref 0.0–0.2)

## 2018-03-18 LAB — BODY FLUID CELL COUNT WITH DIFFERENTIAL
EOS FL: 0 %
Lymphs, Fluid: 21 %
Monocyte-Macrophage-Serous Fluid: 2 % — ABNORMAL LOW (ref 50–90)
Neutrophil Count, Fluid: 77 % — ABNORMAL HIGH (ref 0–25)
Other Cells, Fluid: 0 %
Total Nucleated Cell Count, Fluid: 2055 cu mm — ABNORMAL HIGH (ref 0–1000)

## 2018-03-18 LAB — BLOOD GAS, ARTERIAL
Acid-Base Excess: 3.8 mmol/L — ABNORMAL HIGH (ref 0.0–2.0)
BICARBONATE: 30.2 mmol/L — AB (ref 20.0–28.0)
Delivery systems: POSITIVE
Drawn by: 362771
EXPIRATORY PAP: 6
FIO2: 100
INSPIRATORY PAP: 16
O2 SAT: 84.5 %
Patient temperature: 97.5
pCO2 arterial: 66.2 mmHg (ref 32.0–48.0)
pH, Arterial: 7.278 — ABNORMAL LOW (ref 7.350–7.450)
pO2, Arterial: 56 mmHg — ABNORMAL LOW (ref 83.0–108.0)

## 2018-03-18 LAB — LACTIC ACID, PLASMA
Lactic Acid, Venous: 3.7 mmol/L (ref 0.5–1.9)
Lactic Acid, Venous: 2.8 mmol/L (ref 0.5–1.9)

## 2018-03-18 LAB — LACTATE DEHYDROGENASE, PLEURAL OR PERITONEAL FLUID: LD, Fluid: 421 U/L — ABNORMAL HIGH (ref 3–23)

## 2018-03-18 LAB — CORTISOL-AM, BLOOD: CORTISOL - AM: 51.1 ug/dL — AB (ref 6.7–22.6)

## 2018-03-18 LAB — OSMOLALITY, URINE
Osmolality, Ur: 708 mOsm/kg (ref 300–900)
Osmolality, Ur: 645 mOsm/kg (ref 300–900)

## 2018-03-18 LAB — PROTIME-INR
INR: 1.11
Prothrombin Time: 14.2 seconds (ref 11.4–15.2)

## 2018-03-18 LAB — ECHOCARDIOGRAM COMPLETE
Height: 63 in
Weight: 2442.34 oz

## 2018-03-18 LAB — PROTEIN, PLEURAL OR PERITONEAL FLUID: Total protein, fluid: 3.2 g/dL

## 2018-03-18 LAB — TRIGLYCERIDES: Triglycerides: 140 mg/dL (ref <150)

## 2018-03-18 SURGERY — INSERTION, TUNNELED CENTRAL VENOUS DEVICE, WITH PORT
Anesthesia: Monitor Anesthesia Care

## 2018-03-18 MED ORDER — FENTANYL CITRATE (PF) 100 MCG/2ML IJ SOLN: 50.0000 ug | Freq: Once | INTRAMUSCULAR | Status: AC

## 2018-03-18 MED ORDER — PANTOPRAZOLE SODIUM 40 MG IV SOLR: 40.0000 mg | Freq: Every day | INTRAVENOUS | Status: DC

## 2018-03-18 MED ORDER — PROPOFOL 1000 MG/100ML IV EMUL
INTRAVENOUS | Status: AC
  Filled 2018-03-18: qty 100

## 2018-03-18 MED ORDER — MIDAZOLAM HCL 2 MG/2ML IJ SOLN
2.0000 mg | Freq: Once | INTRAMUSCULAR | Status: AC
  Administered 2018-03-18: 2 mg via INTRAVENOUS

## 2018-03-18 MED ORDER — FENTANYL 2500MCG IN NS 250ML (10MCG/ML) PREMIX INFUSION
25.0000 ug/h | INTRAVENOUS | Status: DC
  Administered 2018-03-18: 50 ug/h via INTRAVENOUS
  Administered 2018-03-19: 200 ug/h via INTRAVENOUS
  Administered 2018-03-20: 100 ug/h via INTRAVENOUS
  Administered 2018-03-21 – 2018-03-22 (×3): 200 ug/h via INTRAVENOUS
  Administered 2018-03-23: 150 ug/h via INTRAVENOUS
  Administered 2018-03-23: 200 ug/h via INTRAVENOUS
  Filled 2018-03-18 (×11): qty 250

## 2018-03-18 MED ORDER — LORAZEPAM 2 MG/ML IJ SOLN
2.0000 mg | Freq: Once | INTRAMUSCULAR | Status: AC
  Administered 2018-03-18: 2 mg via INTRAVENOUS
  Filled 2018-03-18: qty 1

## 2018-03-18 MED ORDER — FENTANYL CITRATE (PF) 100 MCG/2ML IJ SOLN
50.0000 ug | Freq: Once | INTRAMUSCULAR | Status: AC
  Administered 2018-03-18: 50 ug via INTRAVENOUS
  Filled 2018-03-18: qty 2

## 2018-03-18 MED ORDER — CHLORHEXIDINE GLUCONATE 0.12% ORAL RINSE (MEDLINE KIT)
15.0000 mL | Freq: Two times a day (BID) | OROMUCOSAL | Status: DC
  Administered 2018-03-18 – 2018-03-24 (×11): 15 mL via OROMUCOSAL

## 2018-03-18 MED ORDER — ORAL CARE MOUTH RINSE
15.0000 mL | OROMUCOSAL | Status: DC
  Administered 2018-03-18 – 2018-03-24 (×51): 15 mL via OROMUCOSAL

## 2018-03-18 MED ORDER — PHENYLEPHRINE HCL-NACL 40-0.9 MG/250ML-% IV SOLN
0.0000 ug/min | INTRAVENOUS | Status: DC
  Administered 2018-03-18: 90 ug/min via INTRAVENOUS
  Administered 2018-03-18: 50 ug/min via INTRAVENOUS
  Administered 2018-03-19: 150 ug/min via INTRAVENOUS
  Administered 2018-03-19 – 2018-03-20 (×2): 250 ug/min via INTRAVENOUS
  Administered 2018-03-20: 220 ug/min via INTRAVENOUS
  Administered 2018-03-20: 250 ug/min via INTRAVENOUS
  Administered 2018-03-20: 300 ug/min via INTRAVENOUS
  Administered 2018-03-20: 250 ug/min via INTRAVENOUS
  Administered 2018-03-20: 300 ug/min via INTRAVENOUS
  Administered 2018-03-20 – 2018-03-21 (×3): 250 ug/min via INTRAVENOUS
  Administered 2018-03-21 – 2018-03-22 (×2): 275 ug/min via INTRAVENOUS
  Administered 2018-03-22: 280 ug/min via INTRAVENOUS
  Administered 2018-03-22: 275 ug/min via INTRAVENOUS
  Administered 2018-03-22: 285 ug/min via INTRAVENOUS
  Administered 2018-03-22: 280 ug/min via INTRAVENOUS
  Administered 2018-03-22: 285 ug/min via INTRAVENOUS
  Administered 2018-03-22: 275 ug/min via INTRAVENOUS
  Administered 2018-03-22 (×2): 285 ug/min via INTRAVENOUS
  Administered 2018-03-23: 60 ug/min via INTRAVENOUS
  Administered 2018-03-23 (×3): 280 ug/min via INTRAVENOUS
  Administered 2018-03-23: 60 ug/min via INTRAVENOUS
  Filled 2018-03-18 (×42): qty 250

## 2018-03-18 MED ORDER — PHENYLEPHRINE 40 MCG/ML (10ML) SYRINGE FOR IV PUSH (FOR BLOOD PRESSURE SUPPORT)
200.0000 ug | PREFILLED_SYRINGE | Freq: Once | INTRAVENOUS | Status: AC
  Administered 2018-03-18: 200 ug via INTRAVENOUS
  Filled 2018-03-18: qty 10

## 2018-03-18 MED ORDER — PHENYLEPHRINE HCL-NACL 10-0.9 MG/250ML-% IV SOLN
0.0000 ug/min | INTRAVENOUS | Status: DC
  Administered 2018-03-18: 10 ug/min via INTRAVENOUS
  Filled 2018-03-18: qty 250

## 2018-03-18 MED ORDER — MIDAZOLAM HCL 2 MG/2ML IJ SOLN
INTRAMUSCULAR | Status: AC
  Filled 2018-03-18: qty 2

## 2018-03-18 MED ORDER — FENTANYL CITRATE (PF) 100 MCG/2ML IJ SOLN: 50.0000 ug | INTRAMUSCULAR | Status: DC | PRN

## 2018-03-18 MED ORDER — FENTANYL CITRATE (PF) 100 MCG/2ML IJ SOLN
INTRAMUSCULAR | Status: AC
  Filled 2018-03-18: qty 2

## 2018-03-18 MED ORDER — ETOMIDATE 2 MG/ML IV SOLN
0.3000 mg/kg | Freq: Once | INTRAVENOUS | Status: AC
  Administered 2018-03-18: 20 mg via INTRAVENOUS

## 2018-03-18 MED ORDER — FENTANYL CITRATE (PF) 100 MCG/2ML IJ SOLN
50.0000 ug | Freq: Once | INTRAMUSCULAR | Status: AC
  Administered 2018-03-18: 50 ug via INTRAVENOUS

## 2018-03-18 MED ORDER — FENTANYL BOLUS VIA INFUSION
25.0000 ug | INTRAVENOUS | Status: DC | PRN
  Administered 2018-03-18 – 2018-03-23 (×5): 25 ug via INTRAVENOUS
  Filled 2018-03-18: qty 25

## 2018-03-18 MED ORDER — PANTOPRAZOLE SODIUM 40 MG PO PACK
40.0000 mg | PACK | Freq: Every day | ORAL | Status: DC
  Administered 2018-03-18 – 2018-03-24 (×7): 40 mg
  Filled 2018-03-18 (×8): qty 20

## 2018-03-18 MED ORDER — PROPOFOL 1000 MG/100ML IV EMUL
0.0000 ug/kg/min | INTRAVENOUS | Status: DC
  Administered 2018-03-18 – 2018-03-19 (×2): 10 ug/kg/min via INTRAVENOUS
  Administered 2018-03-20: 30 ug/kg/min via INTRAVENOUS
  Administered 2018-03-20 (×2): 20 ug/kg/min via INTRAVENOUS
  Administered 2018-03-21: 25 ug/kg/min via INTRAVENOUS
  Administered 2018-03-21: 40 ug/kg/min via INTRAVENOUS
  Administered 2018-03-22 – 2018-03-23 (×4): 25 ug/kg/min via INTRAVENOUS
  Administered 2018-03-24: 15 ug/kg/min via INTRAVENOUS
  Filled 2018-03-18 (×14): qty 100

## 2018-03-18 NOTE — Consult Note (Signed)
PULMONARY / CRITICAL CARE MEDICINE   NAME:  Glenda Jensen, MRN:  [id number], DOB:  Oct 03, 1950, LOS: 1 ADMISSION DATE:  03/31/2018, CONSULTATION DATE:  03/25/2018 REFERRING MD: hospitalist , CHIEF COMPLAINT: shortness of breath   BRIEF HISTORY:    67 year old lady with history of non-small cell lung cancer newly diagnosed with metastasis to the brain coming in with shortness of breath admitted to the hospitalist service now in severe respiratory distress transferred to the ICU for intubation. HISTORY OF PRESENT ILLNESS   [name] is a 68 year old lady with newly diagnosed metastatic non-small cell lung cancer to the brain has not undergone treatment yet admitted to the hospital service overnight with worsening shortness of breath for few days put on BiPAP for healthcare associated pneumonia continue to worsen in regard of her hypoxia hypercapnia and mental status she received multiple Ativan and fentanyl overnight to try to make a cooperative with a BiPAP with no improvement.  I was called early this morning to come and assess her which I came to assess immediately on the floor. Daughter who is a hospice nurse was at bedside.  I am not able to get any history from the patient since her altered mental status but as per daughter patient was just started on some pain medicine for her recent thoracentesis and right chest pain when she thought that most of her breathing issues were secondary to that patient was getting confused at home no fever no chills no rigors.  Patient had a thoracentesis and of November which was showing atypical cells.  MRI of the brain showed right thalamic 2 x 2 centimeter mass.    SIGNIFICANT PAST MEDICAL HISTORY   -Newly diagnosed non-small cell lung cancer with metastasis to the brain -Hypertension -History of cholecystectomy  SIGNIFICANT EVENTS:  Endotracheal intubation 03/11/2018  STUDIES:   CT head 03/15/2018 1. Motion degraded study. Small area of hypo attenuation in  the posterior right temporal lobe may be related to motion artifact although a small acute to subacute area of ischemia cannot be excluded. MRI of the brain could be used to further evaluate as clinically warranted. 2. The mass in the right thalamus is again noted with the abnormal soft tissue extending into the posterior aspect of the right lateral Ventricle.  CULTURES:   ANTIBIOTICS:  Cefepime 03/27/2018 >> Vancomycin 03/29/2018>> Flagyl 03/28/2018>>  LINES/TUBES:   ET tube 04/08/2018  CONSULTANTS:   SUBJECTIVE:    CONSTITUTIONAL: BP (!) 157/89 (BP Location: Left Arm)   Pulse (!) 133   Temp (!) 97.5 F (36.4 C) (Axillary)   Resp (!) 38   Ht 5\' 3"  (1.6 m)   Wt 69.2 kg   SpO2 95%   BMI 27.04 kg/m   No intake/output data recorded.     Vent Mode: BIPAP FiO2 (%):  [50 %-60 %] 60 % Set Rate:  [16 bmp] 16 bmp PEEP:  [6 cmH20] 6 cmH20 Pressure Support:  [13 cmH20] 13 cmH20 Plateau Pressure:  [17 cmH20] 17 cmH20  PHYSICAL EXAM: General: Acutely ill lethargic and severe respiratory distress on BiPAP hypoxic despite being on 100% FiO2 Neuro: Lethargic not following any commands moving both upper extremities to pain HEENT: Dry mucous membranes Cardiovascular: Normal heart sounds no added sounds or murmurs Lungs: Coarse crackles bilaterally no wheezing no dullness Abdomen: Soft no tenderness no organomegaly Musculoskeletal: Bilateral lower limb edema Skin: No rash  RESOLVED PROBLEM LIST   ASSESSMENT AND PLAN   Assessment: - acute hypoxemic hypercapnic respiratory failure  - HCAP -  acute pulmonary edema - newly diagnosed metastatic NSCLC to the brain  - acute metabolic encephalopathy - hyponatremia    Plan: -Immediate transfer to the intensive care unit -Emergent intubation -Adjust mechanical ventilation and use lung protective strategy keep pulse ox above 92% -Trial diuresis when blood pressure is more stable.  Patient dropped her blood pressure after  intubation -Continue cefepime vancomycin and Flagyl -Send for sputum culture -Stat echocardiogram to rule out pericardial effusion -Follow sodium level -Follow urine output and renal function -Hold Lovenox -Patient might benefit from right thoracentesis therapeutic -I had a long discussion with the daughter who is a hospice nurse she wants to continue aggressive care for now and will readdress goals of care as when rest of the family arrive.  Patient is full code  I have spent 55 minutes of critical care time this time was spent bedside or in the unit this time was exclusive of any billable procedures   SUMMARY OF TODAY'S PLAN:    Best Practice / Goals of Care / Disposition.   DVT PROPHYLAXIS:Lovenox SUP:PPI  NUTRITION:NPO MOBILITY:bedrest  GOALS OF CARE:full code  FAMILY DISCUSSIONS: discussed with daughter at bedside  DISPOSITION icu   LABS  Glucose No results for input(s): GLUCAP in the last 168 hours.  BMET Recent Labs  Lab 03/14/18 1016 03/25/2018 1532 03/10/2018 0351  NA 123* 118* 122*  K 3.7 4.7 4.8  CL 84* 76* 79*  CO2 25 28 25   BUN 10 12 12   CREATININE 0.68 0.53 0.70  GLUCOSE 237* 194* 139*    Liver Enzymes Recent Labs  Lab 03/14/18 1016 04/03/2018 1532  AST 17 21  ALT 11 11  ALKPHOS 237* 262*  BILITOT 0.6 0.6  ALBUMIN 2.6* 2.6*    Electrolytes Recent Labs  Lab 03/14/18 1016 03/11/2018 1458 03/21/2018 1532 03/29/2018 0351  CALCIUM 9.3  --  10.2 10.2  MG  --  1.8  --   --     CBC Recent Labs  Lab 03/14/18 1016 04/01/2018 1532  WBC 18.2* 30.3*  HGB 16.5* 16.2*  HCT 49.2* 47.4*  PLT 354 366    ABG Recent Labs  Lab 03/12/2018 1535 03/14/2018 2340 04/08/2018 0620  PHART 7.472* 7.418 7.278*  PCO2ART 42.4 46.3 66.2*  PO2ART 58.4* 60.1* 56.0*    Coag's Recent Labs  Lab 04/05/2018 1532  INR 1.10    Sepsis Markers Recent Labs  Lab 03/15/2018 1532 03/31/2018 1618 03/13/2018 1843 03/12/2018 0225  LATICACIDVEN  --  3.6* 2.8* 3.7*  PROCALCITON 1.14   --   --   --     Cardiac Enzymes Recent Labs  Lab 04/01/2018 2005  TROPONINI <0.03    PAST MEDICAL HISTORY :   She  has a past medical history of Cancer ([place]), Fibroids, Hypertension, and Lung mass.  PAST SURGICAL HISTORY:  She  has a past surgical history that includes Cardiac surgery; Cholecystectomy; and IR THORACENTESIS ASP PLEURAL SPACE W/IMG GUIDE (03/05/2018).  No Known Allergies  No current facility-administered medications on file prior to encounter.    Current Outpatient Medications on File Prior to Encounter  Medication Sig  . diltiazem (DILACOR XR) 240 MG 24 hr capsule Take 240 mg by mouth every morning.   . furosemide (LASIX) 20 MG tablet Take 1 tablet (20 mg total) by mouth daily.  Marland Kitchen gabapentin (NEURONTIN) 100 MG capsule Take 1 capsule (100 mg total) by mouth 2 (two) times daily. (Patient taking differently: Take 100 mg by mouth at bedtime. )  . Misc  Natural Products (GINSENG COMPLEX PO) Take 2 capsules by mouth 2 (two) times daily.  Marland Kitchen OVER THE COUNTER MEDICATION Take 1 mL by mouth 3 (three) times daily as needed (for pain). Hemp Oil  . oxyCODONE 10 MG TABS Take 1 tablet (10 mg total) by mouth every 6 (six) hours as needed for severe pain.  . polyethylene glycol powder (GLYCOLAX/MIRALAX) powder Take 17 g by mouth daily as needed for mild constipation or moderate constipation.  . prochlorperazine (COMPAZINE) 10 MG tablet Take 1 tablet (10 mg total) by mouth every 6 (six) hours as needed for nausea or vomiting.  Marland Kitchen HYDROcodone-acetaminophen (NORCO) 5-325 MG tablet Take 1 tablet by mouth every 8 (eight) hours as needed for moderate pain. (Patient not taking: Reported on 03/14/2018)    FAMILY HISTORY:   Her family history includes Cancer in her maternal uncle; Colon cancer in her maternal uncle; Dementia in her mother; Diabetes in her mother; Heart disease in her maternal grandfather; Hypertension in her father.  SOCIAL HISTORY:  She  reports that she quit smoking about 4  weeks ago. Her smoking use included cigarettes. She has a 1.50 pack-year smoking history. She has never used smokeless tobacco. She reports that she drinks alcohol. She reports that she has current or past drug history.  REVIEW OF SYSTEMS:    Could not be obtained due to altered mental status

## 2018-03-18 NOTE — Progress Notes (Signed)
0530: pt started to desat to 80's on bi-pap. Bi-pap setting to max 100%. PT barely sating 90-91%. RR 33-39/min, cyanotic, cool to touch. RT paged. RT was at bedside.  9093: Rapid response paged.  0545: paged on call [facility], X Blount NP called back @0547 , updated on pt's condition,stated she will be by bedside and will inform critical care MD. 0600: Rapid response and X blount NP by bedside. NP talked to pt's daughter who is a hospice care nurse. Discussed about end of life care or intubation or where to go from here, family in denial, stated she needs to talk to two other sister. She stated " I am not trying to be selfish but I need to intubate her for my sisters". Family was very hopeful chemo and radiation will manage the condition.  1121: Critical care MD on bedside, Daughter very tearful. Pt's sats have been to 60's and came up to 96 on 100% bi-pap. Daughter decided to go ahead and go with intubation since pt doesn't have HOA or living will.  0700: transferred pt to 61M05 via bed with bi-pap along with rapid response RN, RT and family came along. CCMD called and notified. Bedside report given to [facility] in 61M. Pt's family in the waiting room.

## 2018-03-18 NOTE — Progress Notes (Signed)
PULMONARY / CRITICAL CARE MEDICINE   NAME:  Glenda Jensen, MRN:  [id number], DOB:  [birth year], LOS: 1 ADMISSION DATE:  03/30/2018, CONSULTATION DATE:  04/05/2018 REFERRING MD: hospitalist , CHIEF COMPLAINT: shortness of breath   BRIEF HISTORY:    67 year old lady with history of non-small cell lung cancer newly diagnosed with metastasis to the brain coming in with shortness of breath admitted to the hospitalist service now in severe respiratory distress transferred to the ICU for intubation. 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  I was called early this morning to come and assess her which I came to assess immediately on the floor. Daughter who is a hospice nurse was at bedside.  I am not able to get any history from the patient since her altered mental status but as per daughter patient was just started on some pain medicine for her recent thoracentesis and right chest pain when she thought that most of her breathing issues were secondary to that patient was getting confused at home no fever no chills no rigors.  Patient had a thoracentesis and of November which was showing atypical cells.  MRI of the brain showed right thalamic 2 x 2 centimeter mass.    SIGNIFICANT PAST MEDICAL HISTORY   -Newly diagnosed non-small cell lung cancer with metastasis to the brain -Hypertension -History of cholecystectomy  SIGNIFICANT EVENTS:  Endotracheal intubation 03/26/2018  STUDIES:   CT head 03/22/2018 1. Motion degraded study. Small area of hypo attenuation in  the posterior right temporal lobe may be related to motion artifact although a small acute to subacute area of ischemia cannot be excluded. MRI of the brain could be used to further evaluate as clinically warranted. 2. The mass in the right thalamus is again noted with the abnormal soft tissue extending into the posterior aspect of the right lateral Ventricle.  CULTURES:   ANTIBIOTICS:  Cefepime 04/09/2018 >> Vancomycin 03/25/2018>> Flagyl 04/07/2018>>  LINES/TUBES:   ET tube 03/20/2018 >>   CONSULTANTS:   SUBJECTIVE:  Patient intubated and ventilated this morning due to acute decompensation, bilateral infiltrates Currently requiring 100% FiO2, PEEP 14  CONSTITUTIONAL: BP (!) 84/57   Pulse (!) 114   Temp 98.6 F (37 C) (Oral)   Resp (!) 33   Ht 5\' 3"  (1.6 m)   Wt 69.2 kg   SpO2 100%   BMI 27.04 kg/m   I/O last 3 completed shifts: In: 469.6 [I.V.:119.6; IV Piggyback:350] Out: -      Vent Mode: PCV FiO2 (%):  [50 %-100 %] 100 % Set Rate:  [16 bmp-22 bmp] 22 bmp PEEP:  [6 cmH20-14 cmH20] 14 cmH20 Pressure Support:  [13 cmH20] 13 cmH20 Plateau Pressure:  [14 cmH20-17 cmH20] 14 cmH20  PHYSICAL EXAM: General: ill appearing woman, ventilated Neuro: wakes to voice and tries to speak, moves all ext HEENT: ETT in place Cardiovascular: Normal heart sounds no added sounds or murmurs Lungs: Rhonchi B, more on the R Abdomen: Soft no tenderness no organomegaly Musculoskeletal:trace B LE edema Skin: no rashes    RESOLVED PROBLEM LIST   ASSESSMENT  AND PLAN   Assessment:  Acute hypoxemic respiratory failure with bilateral pulmonary infiltrates consistent with ARDS; question cause, consider aspiration pneumonitis especially with suppressed mental status preceding admission Mechanical ventilation, transition to formal ARDS protocol Goal volume removal if / when she can tolerate from BP and renal standpoints Empiric abx beginning 12/9 >> vanco + cefepime +  flagyl Consider BD's, no current wheeze, no secretions.  VAP prevention orders.   Shock.  Suspect septic shock but consider cardiogenic component, volume status, sedating medications. Phenylephrine initiated Defer any diuresis given current hemodynamics May require central venous access, consider PICC Echocardiogram ordered eval LV fxn and for possible pericardial effusion.   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  Poorly differentiated non-small cell lung cancer with metastatic disease to brain, probably to the right pleura Has not started therapy yet. In theory should be interventions available, brain XRT, chemoradiation, immunotherapy, etc. Unfortunately the acute events are going to significantly impact overall prognosis. I don't see evidence for a post-obstructive process or any cause for the current decompensation that would point to urgent rx for the CA (urgent XRT, etc)/.   Hyponatremia.  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  Component of medications, hyponatremia.  No evidence for seizure activity noted Adjust sedation, plan to initiate fentanyl drip, possibly wean propofol if able   SUMMARY OF TODAY'S PLAN:    Best Practice / Goals of Care / Disposition.   DVT PROPHYLAXIS:Lovenox SUP:PPI  NUTRITION:NPO MOBILITY:bedrest  GOALS OF CARE:full code  FAMILY DISCUSSIONS: discussed with daughters at bedside 12/9 DISPOSITION icu   LABS  Glucose Recent Labs  Lab 03/20/2018 0825  GLUCAP 123*    BMET Recent Labs  Lab 04/03/2018 1532 04/04/2018 0351 03/25/2018 0716  NA 118* 122* 122*  K 4.7 4.8 4.6  CL 76* 79* 79*  CO2 28 25 30   BUN 12 12 12   CREATININE 0.53 0.70 0.67  GLUCOSE 194* 139* 154*    Liver Enzymes Recent Labs  Lab 03/14/18 1016  03/16/2018 1532  AST 17 21  ALT 11 11  ALKPHOS 237* 262*  BILITOT 0.6 0.6  ALBUMIN 2.6* 2.6*    Electrolytes Recent Labs  Lab 03/28/2018 1458 03/14/2018 1532 03/25/2018 0351 03/15/2018 0716  CALCIUM  --  10.2 10.2 9.5  MG 1.8  --   --   --     CBC Recent Labs  Lab 03/14/18 1016 03/20/2018 1532 03/16/2018 0716  WBC 18.2* 30.3* 26.3*  HGB 16.5* 16.2* 13.2  HCT 49.2* 47.4* 39.9  PLT 354 366 361    ABG Recent Labs  Lab 03/15/2018 2340 03/20/2018 0620 03/21/2018 0937  PHART 7.418 7.278* 7.487*  PCO2ART 46.3 66.2* 39.1  PO2ART 60.1* 56.0* 63.0*    Coag's Recent Labs  Lab 03/20/2018 1532 03/28/2018 0716  INR 1.10 1.11    Sepsis Markers Recent Labs  Lab 04/04/2018 1532  03/31/2018 1843 03/30/2018 0225 03/16/2018 0716  LATICACIDVEN  --    < > 2.8* 3.7* 2.8*  PROCALCITON 1.14  --   --   --   --    < > = values in this interval not displayed.    Cardiac Enzymes Recent Labs  Lab 03/13/2018 2005  TROPONINI <0.03   Independent CC time 39 minutes   Baltazar Apo, MD, PhD 03/30/2018, 12:24 PM [facility] [phone] or if no answer [phone]

## 2018-03-18 NOTE — Progress Notes (Signed)
  Echocardiogram 2D Echocardiogram has been performed.  Jennette Dubin 03/28/2018, 9:37 AM

## 2018-03-18 NOTE — Procedures (Signed)
Central Venous Catheter Insertion Procedure Note Glenda Jensen [id number] [birth year]  Procedure: Insertion of Central Venous Catheter Indications: Drug and/or fluid administration  Procedure Details Consent: Risks of procedure as well as the alternatives and risks of each were explained to the (patient/caregiver).  Consent for procedure obtained. Time Out: Verified patient identification, verified procedure, site/side was marked, verified correct patient position, special equipment/implants available, medications/allergies/relevent history reviewed, required imaging and test results available.  Performed  Maximum sterile technique was used including antiseptics, cap, gloves, gown, hand hygiene, mask and sheet. Skin prep: Chlorhexidine; local anesthetic administered A antimicrobial bonded/coated triple lumen catheter was placed in the right femoral vein due to high levels of PEEP required on vent, increased pneumothorax risk using the Seldinger technique. Catheter placed to 20 cm. Blood aspirated via all 3 ports and then flushed x 3. Line sutured x 2 and dressing applied.  Ultrasound guidance used.Yes.    Evaluation Blood flow good Complications: No apparent complications Patient did tolerate procedure well. Chest X-ray ordered to verify placement.  CXR: pending.   Georgann Housekeeper, AGACNP-BC [place] Pager [phone] or [phone]  04/08/2018 3:31 PM

## 2018-03-18 NOTE — Procedures (Signed)
Arterial Catheter Insertion Procedure Note Paisli Silfies [id number] 02-19-1951  Procedure: Insertion of Arterial Catheter  Indications: Blood pressure monitoring  Procedure Details Consent: Risks of procedure as well as the alternatives and risks of each were explained to the (patient/caregiver).  Consent for procedure obtained. Time Out: Verified patient identification, verified procedure, site/side was marked, verified correct patient position, special equipment/implants available, medications/allergies/relevent history reviewed, required imaging and test results available.  Performed  Maximum sterile technique was used including antiseptics, cap, gloves, gown, hand hygiene, mask and sheet. Skin prep: Chlorhexidine; local anesthetic administered 20 gauge catheter was inserted into right femoral artery using the Seldinger technique. ULTRASOUND GUIDANCE USED: YES Evaluation Blood flow good; BP tracing good. Complications: No apparent complications.   Georgann Housekeeper, AGACNP-BC [place] Pager [phone] or [phone]  03/31/2018 3:34 PM

## 2018-03-18 NOTE — Progress Notes (Signed)
Text Paged on call NP X Blount with critical lactic acid level of 3.7. Waiting on orders.

## 2018-03-18 NOTE — Progress Notes (Signed)
Dr. Lamonte Sakai updated to assessments, interventions, responses. MAP >/= 60 acceptable until further orders.

## 2018-03-18 NOTE — Procedures (Signed)
Intubation Procedure Note Glenda Jensen [id number] 1951/03/23  Procedure: Intubation Indications: Acute hypoxemic hypercapnic respiratory failure  Procedure Details Consent: Risks of procedure as well as the alternatives and risks of each were explained to the (patient/caregiver).  Consent for procedure obtained. Time Out: Verified patient identification, verified procedure, site/side was marked, verified correct patient position, special equipment/implants available, medications/allergies/relevent history reviewed, required imaging and test results available.  Performed  Maximum sterile technique was used including gloves and mask.  MAC and 3 patient was intubated from first attempt    Evaluation Hemodynamic Status: BP stable throughout; dropped her blood pressure after intubation O2 sats: transiently fell during during procedure Patient's Current Condition: stable Complications: No apparent complications Patient did tolerate procedure well. Chest X-ray ordered to verify placement.  CXR: pending.   Glenda Jensen 03/30/2018

## 2018-03-18 NOTE — Procedures (Signed)
Chest Tube Insertion Procedure Note  Indications:  Clinically significant Effusion  Pre-operative Diagnosis: Effusion Right sided  Post-operative Diagnosis: Effusion Right sided  Procedure Details  Informed consent was obtained for the procedure, including sedation.  Risks of lung perforation, hemorrhage, arrhythmia, and adverse drug reaction were discussed.   After sterile skin prep, using seldinger technique, a pigtail chest tube was placed in the right 6th  rib space.  Findings: 50 ml of serosanguinous fluid obtained  Estimated Blood Loss:  Minimal         Specimens:  Pending              Complications:  None; patient tolerated the procedure well.         Disposition: ICU - intubated and critically ill.         Condition: stable   Georgann Housekeeper, AGACNP-BC [place] Pager [phone] or [phone]  03/16/2018 3:36 PM

## 2018-03-18 NOTE — Progress Notes (Signed)
[facility] Progress Note Patient Name: Glenda Jensen DOB: 04/08/1951 MRN: [id number]   Date of Service  04/04/2018  HPI/Events of Note  Agitation - Request for bilateral soft wrist restraints.   eICU Interventions  Will order bilateral soft wrist restraints.      Intervention Category Major Interventions: Delirium, psychosis, severe agitation - evaluation and management  Sommer,Steven Eugene 03/28/2018, 11:47 PM

## 2018-03-18 NOTE — Significant Event (Signed)
Rapid Response Event Note  Overview:  Called by RN for pt with increased WOB and spO2 90% despite being on Bipap since admission.      On arrival, pt in resp distress with agonal respirations. RR 36, spO2 93% on 100%FiO2. HR 132, BP 157/89. Pt cyanotic and unresponsive. NP Blount at bedside talking to daughter about pt status. Daughter states she needs to call other family members and discuss plan of care going forward. CCM has been called and will also come see pt. Lung sounds-rhonchi/wheezing throughout. Repeat abg obtained. Daughter calling siblings to discuss plan and decided to keep pt full code. CCM at bedside and ordered transfer to ICU to intubate per family wishes. Pt transferred to 81M-05, report given to ICU RN.     Event Summary:  called at  [facility]    Event ended at  [place]

## 2018-03-18 NOTE — Progress Notes (Addendum)
2320: paged on call [facility] NP X Blount.  2328: NP called back, updated  that pt is very agitated, restless, tachycardic and cyanotic, confused and fighting bi-pap. Received order from stat ABGs and IV haldol.  2335: X blount NP by bedside, Iv haldol administered. Pt still very restless, fighting bi-pap, confused.  0044: pt still could not rest after dose of Haldol, 2mg  additional Ativan given per MD order. Pt started resting a little but still trying to pull bi-pap off. Daughter by bedside holding her hand. BP and HR little better than earlier. Will continue to monitor.

## 2018-03-18 NOTE — Progress Notes (Signed)
Report received from Bosie Helper from [facility].

## 2018-03-18 NOTE — Progress Notes (Signed)
Pt received from [facility] via care[facility]. Pt very agitated, cyanotic, confused, on bypap. Constantly trying to take bi-pap off. Pt had received IV fentanyl per carelink before transportation. Pt is very restless. RT by bedside. Connected to tele, HR in 140's, hypertensive. CCMD notified. HG bath and peri care completed. Needed two person to clam her down, held her hands the whole time if not she would pull her bi-pap off every chance she gets. Family by bedside, will page on call MD.

## 2018-03-19 ENCOUNTER — Inpatient Hospital Stay (HOSPITAL_COMMUNITY): Payer: Medicare Other

## 2018-03-19 DIAGNOSIS — J80 Acute respiratory distress syndrome: Secondary | ICD-10-CM

## 2018-03-19 LAB — CBC
HCT: 39.2 % (ref 36.0–46.0)
Hemoglobin: 12.5 g/dL (ref 12.0–15.0)
MCH: 28.5 pg (ref 26.0–34.0)
MCHC: 31.9 g/dL (ref 30.0–36.0)
MCV: 89.3 fL (ref 80.0–100.0)
Platelets: 326 10*3/uL (ref 150–400)
RBC: 4.39 MIL/uL (ref 3.87–5.11)
RDW: 12.4 % (ref 11.5–15.5)
WBC: 21.4 10*3/uL — ABNORMAL HIGH (ref 4.0–10.5)
nRBC: 0 % (ref 0.0–0.2)

## 2018-03-19 LAB — POCT I-STAT 3, ART BLOOD GAS (G3+)
Acid-Base Excess: 4 mmol/L — ABNORMAL HIGH (ref 0.0–2.0)
Acid-Base Excess: 1 mmol/L (ref 0.0–2.0)
Bicarbonate: 30.9 mmol/L — ABNORMAL HIGH (ref 20.0–28.0)
Bicarbonate: 28.5 mmol/L — ABNORMAL HIGH (ref 20.0–28.0)
O2 SAT: 100 %
O2 Saturation: 89 %
Patient temperature: 98.9
TCO2: 33 mmol/L — ABNORMAL HIGH (ref 22–32)
TCO2: 30 mmol/L (ref 22–32)
pCO2 arterial: 56.6 mmHg — ABNORMAL HIGH (ref 32.0–48.0)
pCO2 arterial: 54.6 mmHg — ABNORMAL HIGH (ref 32.0–48.0)
pH, Arterial: 7.347 — ABNORMAL LOW (ref 7.350–7.450)
pH, Arterial: 7.326 — ABNORMAL LOW (ref 7.350–7.450)
pO2, Arterial: 260 mmHg — ABNORMAL HIGH (ref 83.0–108.0)
pO2, Arterial: 62 mmHg — ABNORMAL LOW (ref 83.0–108.0)

## 2018-03-19 LAB — GLUCOSE, CAPILLARY
GLUCOSE-CAPILLARY: 64 mg/dL — AB (ref 70–99)
Glucose-Capillary: 79 mg/dL (ref 70–99)
Glucose-Capillary: 74 mg/dL (ref 70–99)
Glucose-Capillary: 68 mg/dL — ABNORMAL LOW (ref 70–99)
Glucose-Capillary: 99 mg/dL (ref 70–99)
Glucose-Capillary: 118 mg/dL — ABNORMAL HIGH (ref 70–99)
Glucose-Capillary: 67 mg/dL — ABNORMAL LOW (ref 70–99)
Glucose-Capillary: 98 mg/dL (ref 70–99)

## 2018-03-19 LAB — BASIC METABOLIC PANEL
Anion gap: 10 (ref 5–15)
Anion gap: 12 (ref 5–15)
BUN: 16 mg/dL (ref 8–23)
BUN: 17 mg/dL (ref 8–23)
CO2: 30 mmol/L (ref 22–32)
CO2: 27 mmol/L (ref 22–32)
Calcium: 9.3 mg/dL (ref 8.9–10.3)
Calcium: 9.2 mg/dL (ref 8.9–10.3)
Chloride: 84 mmol/L — ABNORMAL LOW (ref 98–111)
Chloride: 84 mmol/L — ABNORMAL LOW (ref 98–111)
Creatinine, Ser: 0.64 mg/dL (ref 0.44–1.00)
Creatinine, Ser: 0.67 mg/dL (ref 0.44–1.00)
GFR calc Af Amer: >60 mL/min (ref >60)
GFR calc Af Amer: >60 mL/min (ref >60)
GFR calc non Af Amer: >60 mL/min (ref >60)
GFR calc non Af Amer: >60 mL/min (ref >60)
Glucose, Bld: 91 mg/dL (ref 70–99)
Glucose, Bld: 94 mg/dL (ref 70–99)
Potassium: 4.5 mmol/L (ref 3.5–5.1)
Potassium: 4.2 mmol/L (ref 3.5–5.1)
Sodium: 124 mmol/L — ABNORMAL LOW (ref 135–145)
Sodium: 123 mmol/L — ABNORMAL LOW (ref 135–145)

## 2018-03-19 LAB — PHOSPHORUS: Phosphorus: 3.7 mg/dL (ref 2.5–4.6)

## 2018-03-19 LAB — MAGNESIUM: Magnesium: 1.8 mg/dL (ref 1.7–2.4)

## 2018-03-19 LAB — LACTIC ACID, PLASMA: Lactic Acid, Venous: 2 mmol/L (ref 0.5–1.9)

## 2018-03-19 LAB — HIV ANTIBODY (ROUTINE TESTING W REFLEX): HIV Screen 4th Generation wRfx: NONREACTIVE

## 2018-03-19 LAB — MRSA PCR SCREENING: MRSA by PCR: NEGATIVE

## 2018-03-19 MED ORDER — DEXTROSE 50 % IV SOLN
12.5000 g | INTRAVENOUS | Status: AC
  Administered 2018-03-19: 12.5 g via INTRAVENOUS

## 2018-03-19 MED ORDER — MAGNESIUM SULFATE IN D5W 1-5 GM/100ML-% IV SOLN
1.0000 g | Freq: Once | INTRAVENOUS | Status: AC
  Administered 2018-03-19: 1 g via INTRAVENOUS
  Filled 2018-03-19: qty 100

## 2018-03-19 MED ORDER — DEXTROSE 50 % IV SOLN
12.5000 g | INTRAVENOUS | Status: AC
  Administered 2018-03-19: 12.5 g via INTRAVENOUS
  Filled 2018-03-19: qty 50

## 2018-03-19 MED ORDER — DEXTROSE 50 % IV SOLN
INTRAVENOUS | Status: AC
  Filled 2018-03-19: qty 50

## 2018-03-19 MED ORDER — PRO-STAT SUGAR FREE PO LIQD
30.0000 mL | Freq: Four times a day (QID) | ORAL | Status: DC
  Administered 2018-03-19 – 2018-03-22 (×14): 30 mL
  Filled 2018-03-19 (×12): qty 30

## 2018-03-19 MED ORDER — VITAL AF 1.2 CAL PO LIQD
1000.0000 mL | ORAL | Status: DC
  Administered 2018-03-19 – 2018-03-20 (×2): 1000 mL

## 2018-03-19 NOTE — Progress Notes (Signed)
PULMONARY / CRITICAL CARE MEDICINE   NAME:  Glenda Jensen, MRN:  [id number], DOB:  June 06, 1950, LOS: 2 ADMISSION DATE:  04/08/2018, CONSULTATION DATE:  03/10/2018 REFERRING MD: hospitalist , CHIEF COMPLAINT: shortness of breath   BRIEF HISTORY:    67 year old lady with history of non-small cell lung cancer newly diagnosed with metastasis to the brain coming in with shortness of breath admitted to the hospitalist service now in severe respiratory distress transferred to the ICU for intubation. 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  I was called early this morning to come and assess her which I came to assess immediately on the floor. Daughter who is a hospice nurse was at bedside.  I am not able to get any history from the patient since her altered mental status but as per daughter patient was just started on some pain medicine for her recent thoracentesis and right chest pain when she thought that most of her breathing issues were secondary to that patient was getting confused at home no fever no chills no rigors.  Patient had a thoracentesis and of November which was showing atypical cells.  MRI of the brain showed right thalamic 2 x 2 centimeter mass.    SIGNIFICANT PAST MEDICAL HISTORY   -Newly diagnosed non-small cell lung cancer with metastasis to the brain -Hypertension -History of cholecystectomy  SIGNIFICANT EVENTS:  Endotracheal intubation 04/08/2018  STUDIES:   CT head 03/16/2018 1. Motion degraded study. Small area of hypo attenuation in  the posterior right temporal lobe may be related to motion artifact although a small acute to subacute area of ischemia cannot be excluded. MRI of the brain could be used to further evaluate as clinically warranted. 2. The mass in the right thalamus is again noted with the abnormal soft tissue extending into the posterior aspect of the right lateral Ventricle.  CULTURES:  04/05/2018 chest two-point>> 03/26/2018 blood culture>> 03/19/2018 sputum culture>> ANTIBIOTICS:  Cefepime 04/01/2018 >> Vancomycin 03/31/2018>> Flagyl 03/16/2018>>  LINES/TUBES:   ET tube 03/21/2018 >>  Right radial A-line 04/02/2018>>  right [place] pneumothorax chest tube 03/23/2018>>  CONSULTANTS:   SUBJECTIVE:  Patient intubated and ventilated this morning due to acute decompensation, bilateral infiltrates Currently requiring 100% FiO2, PEEP 14  CONSTITUTIONAL: BP 96/63   Pulse 86   Temp 98.7 F (37.1 C) (Oral)   Resp (!) 33   Ht 5\' 3"  (1.6 m)   Wt 67.8 kg   SpO2 100%   BMI 26.48 kg/m   I/O last 3 completed shifts: In: 2010.5 [I.V.:1076.6; IV Piggyback:934] Out: 1700 [Urine:600; Emesis/NG output:450; Chest Tube:650]     Vent Mode: PRVC FiO2 (%):  [80 %-100 %] 80 % Set Rate:  [33 bmp] 33 bmp Vt Set:  [310 mL] 310 mL PEEP:  [10 cmH20-14 cmH20] 10 cmH20 Plateau Pressure:  [14 cmH20-31 cmH20] 24 cmH20  PHYSICAL EXAM: General: ill appearing woman, ventilated Neuro: wakes to voice and tries to speak, moves all ext HEENT: ETT in place Cardiovascular: Normal heart sounds no added sounds or murmurs Lungs: Rhonchi B, more on the R Abdomen: Soft  no tenderness no organomegaly Musculoskeletal:trace B LE edema Skin: no rashes    RESOLVED PROBLEM LIST   ASSESSMENT AND PLAN   Assessment:  Acute hypoxemic respiratory failure with bilateral pulmonary infiltrates consistent with ARDS; question cause, consider aspiration pneumonitis especially with suppressed mental status preceding admission Vent  bundle ARDS protocol Wean FiO2 and PEEP as tolerated Continue empiric antibiotics started on 12 9 with vancomycin cefepime and Flagyl Bronchodilators if needed   Shock.  Suspect septic shock but consider cardiogenic component, volume status, sedating medications. Vasopressor support with Neo-Synephrine No diuresis at this time Central line is been placed I have asked for them to monitor the CVP 2D echo on 03/20/2018 does not demonstrate any kind of pericardial restriction or ventricle restriction  Large right pleural effusion, presumed malignant Right tube chest tube placed 03/12/2018 Drain as tolerated  Poorly differentiated non-small cell lung cancer with metastatic disease to brain, probably to the right pleura He has not started therapy at this time. When hemodynamically stable consideration for chemo and radiation  Hyponatremia.  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  Component of medications, hyponatremia.  No evidence for seizure activity noted Propofol as needed along with fentanyl  Hypoglycemia D50 as needed Start tube feedings 03/19/2018   SUMMARY OF TODAY'S PLAN:  Wean FiO2 as tolerated Orders written for nutrition via tube feeding  Best Practice / Goals of Care / Disposition.   DVT PROPHYLAXIS:Lovenox SUP:PPI  NUTRITION:NPO/start tube feeding MOBILITY:bedrest  GOALS OF CARE:full code  FAMILY DISCUSSIONS: 03/19/2018 no family at bedside DISPOSITION icu   LABS  Glucose Recent Labs  Lab 04/02/2018 1602 03/22/2018 1924 04/04/2018 2320 03/19/18 0401 03/19/18 0402 03/19/18 0725  GLUCAP 93 83 85 11* 79 74    BMET Recent Labs  Lab 04/09/2018 1832 03/19/18 0105 03/19/18 0448  NA 123* 124* 123*  K 4.8 4.5 4.2  CL 84* 84* 84*  CO2 27 30 27   BUN 14 16 17   CREATININE 0.31* 0.64 0.67  GLUCOSE 101* 91 94    Liver Enzymes Recent Labs  Lab  03/14/18 1016 04/02/2018 1532  AST 17 21  ALT 11 11  ALKPHOS 237* 262*  BILITOT 0.6 0.6  ALBUMIN 2.6* 2.6*    Electrolytes Recent Labs  Lab 04/02/2018 1458  03/13/2018 1832 03/19/18 0105 03/19/18 0448  CALCIUM  --    < > 9.2 9.3 9.2  MG 1.8  --   --   --  1.8  PHOS  --   --   --   --  3.7   < > = values in this interval not displayed.    CBC Recent Labs  Lab 03/16/2018 1532 03/12/2018 0716 03/19/18 0448  WBC 30.3* 26.3* 21.4*  HGB 16.2* 13.2 12.5  HCT 47.4* 39.9 39.2  PLT 366 361 326    ABG Recent Labs  Lab 03/31/2018 0937 03/21/2018 1556 03/19/18 0347  PHART 7.487* 7.375 7.347*  PCO2ART 39.1 53.1* 56.6*  PO2ART 63.0* 55.0* 260.0*    Coag's Recent Labs  Lab 03/16/2018 1532 03/31/2018 0716  INR 1.10 1.11    Sepsis Markers Recent Labs  Lab 03/31/2018 1532  03/27/2018 1843 03/21/2018 0225 04/06/2018 0716  LATICACIDVEN  --    < > 2.8* 3.7* 2.8*  PROCALCITON 1.14  --   --   --   --    < > = values in this interval not displayed.  Cardiac Enzymes Recent Labs  Lab 04/04/2018 2005  TROPONINI <0.03   App cct 35 min   Richardson Landry Minor ACNP Maryanna Shape PCCM Pager [phone] till 1 pm If no answer page [phone] 03/19/2018, 11:37 AM

## 2018-03-19 NOTE — Progress Notes (Signed)
Hypoglycemic Event  CBG:see results Pt with persistant, mild hypoglycemia. Glucose of 68 at 1104 responded to 12.5 of dextroject w/ CBG 94. At 1525, dextroject 12.5 given for 64 with repeat CBG of 69 and additional 12.5 dextroject given; followup cbg 119. TF initiated and supplement given, expecting glucose to level out.  Gates Rigg

## 2018-03-19 NOTE — Progress Notes (Signed)
[facility] Progress Note Patient Name: Glenda Jensen DOB: 1951/04/05 MRN: [id number]   Date of Service  03/19/2018  HPI/Events of Note  Discussed with bedside RN frequent PVCs on monitor.  Electrolytes reviewed. Pt is hemodynamically stable.   eICU Interventions  Check EKG.  Give MgSO4.     Intervention Category Intermediate Interventions: Arrhythmia - evaluation and management  Elsie Lincoln 03/19/2018, 11:02 PM

## 2018-03-19 NOTE — Progress Notes (Signed)
Initial Nutrition Assessment  DOCUMENTATION CODES:   Not applicable  INTERVENTION:    Vital AF 1.2 at 30 ml/h (720 ml per day)  Pro-stat 30 ml QID  Provides 1264 kcal (1467 kcal total with Propofol), 114 gm protein, 584 ml free water daily  NUTRITION DIAGNOSIS:   Inadequate oral intake related to inability to eat as evidenced by estimated needs.  GOAL:   Patient will meet greater than or equal to 90% of their needs  MONITOR:   Vent status, TF tolerance, Labs, I & O's  REASON FOR ASSESSMENT:   Ventilator, Consult Enteral/tube feeding initiation and management  ASSESSMENT:   67 yo female with PMH of lung cancer with brain mets and HTN who was admitted on 12/8 with SOB, requiring transfer to the ICU and intubation on 12/9.  Discussed patient with RN and CCM physician today. Received MD Consult for TF initiation and management. OGT in place. Blood sugar dropped this morning.  Patient is currently intubated on ventilator support  MV: 10.2 L/min Temp (24hrs), Avg:98.8 F (37.1 C), Min:98.4 F (36.9 C), Max:99 F (37.2 C)  Propofol: 7.7 ml/hr providing 203 kcal from lipid  Labs reviewed. Sodium 123 (L) CBG's: [phone] Medications reviewed and include neosynephrine, propofol.   Per review of weight encounters, weight has trended up from 59 kg on 02/16/18.  NUTRITION - FOCUSED PHYSICAL EXAM:    Most Recent Value  Orbital Region  No depletion  Upper Arm Region  No depletion  Thoracic and Lumbar Region  No depletion  Buccal Region  Unable to assess  Temple Region  Mild depletion  Clavicle Bone Region  Mild depletion  Clavicle and Acromion Bone Region  Mild depletion  Scapular Bone Region  Unable to assess  Dorsal Hand  No depletion  Patellar Region  No depletion  Anterior Thigh Region  No depletion  Posterior Calf Region  Mild depletion  Edema (RD Assessment)  Mild  Hair  Reviewed  Eyes  Unable to assess  Mouth  Unable to assess  Skin  Reviewed  Nails   Reviewed       Diet Order:   Diet Order            Diet NPO time specified  Diet effective now              EDUCATION NEEDS:   No education needs have been identified at this time  Skin:  Skin Assessment: Reviewed RN Assessment  Last BM:  none documented since admission  Height:   Ht Readings from Last 1 Encounters:  03/12/2018 5\' 3"  (1.6 m)    Weight:   Wt Readings from Last 1 Encounters:  03/19/18 67.8 kg    Ideal Body Weight:  52.3 kg  BMI:  Body mass index is 26.48 kg/m.  Estimated Nutritional Needs:   Kcal:  1460  Protein:  100-120 gm  Fluid:  1.5 L    Glenda Jensen, RD, LDN, [place] Pager [phone] After Hours Pager [phone]

## 2018-03-20 ENCOUNTER — Telehealth: Payer: Self-pay | Admitting: Radiation Oncology

## 2018-03-20 ENCOUNTER — Inpatient Hospital Stay (HOSPITAL_COMMUNITY): Payer: Medicare Other

## 2018-03-20 ENCOUNTER — Other Ambulatory Visit: Payer: Self-pay | Admitting: Radiation Therapy

## 2018-03-20 ENCOUNTER — Ambulatory Visit (HOSPITAL_COMMUNITY): Admission: RE | Admit: 2018-03-20 | Payer: Medicare Other | Source: Ambulatory Visit

## 2018-03-20 DIAGNOSIS — J90 Pleural effusion, not elsewhere classified: Secondary | ICD-10-CM

## 2018-03-20 LAB — CBC WITH DIFFERENTIAL/PLATELET
Band Neutrophils: 0 %
Basophils Absolute: 0 10*3/uL (ref 0.0–0.1)
Basophils Relative: 0 %
Blasts: 0 %
EOS ABS: 0 10*3/uL (ref 0.0–0.5)
Eosinophils Relative: 0 %
HCT: 40.2 % (ref 36.0–46.0)
Hemoglobin: 12.7 g/dL (ref 12.0–15.0)
Lymphocytes Relative: 3 %
Lymphs Abs: 0.8 10*3/uL (ref 0.7–4.0)
MCH: 28.9 pg (ref 26.0–34.0)
MCHC: 31.6 g/dL (ref 30.0–36.0)
MCV: 91.4 fL (ref 80.0–100.0)
Metamyelocytes Relative: 0 %
Monocytes Absolute: 2.4 10*3/uL — ABNORMAL HIGH (ref 0.1–1.0)
Monocytes Relative: 9 %
Myelocytes: 0 %
Neutro Abs: 23.8 10*3/uL — ABNORMAL HIGH (ref 1.7–7.7)
Neutrophils Relative %: 88 %
Other: 0 %
Platelets: 294 10*3/uL (ref 150–400)
Promyelocytes Relative: 0 %
RBC: 4.4 MIL/uL (ref 3.87–5.11)
RDW: 12.8 % (ref 11.5–15.5)
WBC: 27 10*3/uL — AB (ref 4.0–10.5)
nRBC: 0 % (ref 0.0–0.2)
nRBC: 0 /100 WBC

## 2018-03-20 LAB — GLUCOSE, CAPILLARY
Glucose-Capillary: 100 mg/dL — ABNORMAL HIGH (ref 70–99)
Glucose-Capillary: 105 mg/dL — ABNORMAL HIGH (ref 70–99)
Glucose-Capillary: 11 mg/dL — CL (ref 70–99)
Glucose-Capillary: 124 mg/dL — ABNORMAL HIGH (ref 70–99)
Glucose-Capillary: 131 mg/dL — ABNORMAL HIGH (ref 70–99)
Glucose-Capillary: 133 mg/dL — ABNORMAL HIGH (ref 70–99)
Glucose-Capillary: 81 mg/dL (ref 70–99)
Glucose-Capillary: 90 mg/dL (ref 70–99)

## 2018-03-20 LAB — BLOOD GAS, ARTERIAL
Acid-Base Excess: 0.3 mmol/L (ref 0.0–2.0)
Bicarbonate: 26 mmol/L (ref 20.0–28.0)
Drawn by: 100061
FIO2: 80
MECHVT: 310 mL
O2 Saturation: 98.3 %
PEEP: 10 cmH2O
Patient temperature: 98.9
pCO2 arterial: 55.9 mmHg — ABNORMAL HIGH (ref 32.0–48.0)
pH, Arterial: 7.291 — ABNORMAL LOW (ref 7.350–7.450)
pO2, Arterial: 133 mmHg — ABNORMAL HIGH (ref 83.0–108.0)

## 2018-03-20 LAB — BASIC METABOLIC PANEL
Anion gap: 11 (ref 5–15)
BUN: 23 mg/dL (ref 8–23)
CALCIUM: 9.1 mg/dL (ref 8.9–10.3)
CO2: 26 mmol/L (ref 22–32)
Chloride: 90 mmol/L — ABNORMAL LOW (ref 98–111)
Creatinine, Ser: 0.8 mg/dL (ref 0.44–1.00)
GFR calc Af Amer: >60 mL/min (ref >60)
GFR calc non Af Amer: >60 mL/min (ref >60)
Glucose, Bld: 136 mg/dL — ABNORMAL HIGH (ref 70–99)
Potassium: 4.2 mmol/L (ref 3.5–5.1)
Sodium: 127 mmol/L — ABNORMAL LOW (ref 135–145)

## 2018-03-20 LAB — MAGNESIUM: Magnesium: 2.2 mg/dL (ref 1.7–2.4)

## 2018-03-20 LAB — PHOSPHORUS: Phosphorus: 3.6 mg/dL (ref 2.5–4.6)

## 2018-03-20 MED ORDER — SODIUM CHLORIDE 0.9 % IV SOLN
1.0000 g | Freq: Three times a day (TID) | INTRAVENOUS | Status: DC
  Administered 2018-03-20 – 2018-03-22 (×6): 1 g via INTRAVENOUS
  Filled 2018-03-20 (×7): qty 1

## 2018-03-20 NOTE — Telephone Encounter (Signed)
I called the patient's daughter Hoyle Sauer and we discussed that we will move her MRI and simulation appointments to try to still aim for her [place] brain treatment next week. We are hopeful that Ms. Gonce will be able to wean down with her sedation and pressors to ultimately come off the ventilator. Hoyle Sauer does understand however each day's progress or regression determines what is realistic for her mom's treatment.

## 2018-03-20 NOTE — Progress Notes (Signed)
Pharmacy Antibiotic Note  Glenda Jensen is a 67 y.o. female admitted on 03/22/2018 with chest pain and SOB.  Pharmacy has been consulted for cefepime and vancomycin dosing for RLL PNA - stable on CXR.  Renal function stable, afebrile, WBC up to 27.  Cultures NGTD.  Plan: Vanc 750mg  IV Q12H - consider discontinuing  Reduce cefepime to 1gm IV Q8H Flagyl 500mg  IV Q8H Monitor renal fxn, clinical progress, abx de-escalation   Height: 5\' 3"  (160 cm) Weight: 153 lb 7 oz (69.6 kg) IBW/kg (Calculated) : 52.4  Temp (24hrs), Avg:98.8 F (37.1 C), Min:98.5 F (36.9 C), Max:99.3 F (37.4 C)  Recent Labs  Lab 03/14/18 1016 03/12/2018 1532 04/05/2018 1618 04/06/2018 1843 03/19/2018 0225  03/14/2018 0716 03/26/2018 1832 03/19/18 0105 03/19/18 0448 03/19/18 1130 03/20/18 0434  WBC 18.2* 30.3*  --   --   --   --  26.3*  --   --  21.4*  --  27.0*  CREATININE 0.68 0.53  --   --   --    < > 0.67 0.31* 0.64 0.67  --  0.80  LATICACIDVEN  --   --  3.6* 2.8* 3.7*  --  2.8*  --   --   --  2.0*  --    < > = values in this interval not displayed.    Estimated Creatinine Clearance: 64.8 mL/min (by C-G formula based on SCr of 0.8 mg/dL).    No Known Allergies    Vanc 12/8 >> Cefepime 12/8 >> Flagyl 12/8 >>  12/8 BCx -NGTD 12/9 pleural fluid - NGTD 12/9 MRSA PCR - negative   [name] D. Mina Marble, PharmD, BCPS, [place] 03/20/2018, 7:40 AM

## 2018-03-20 NOTE — Progress Notes (Addendum)
PULMONARY / CRITICAL CARE MEDICINE   NAME:  Glenda Jensen, MRN:  [id number], DOB:  Aug 20, 1950, LOS: 3 ADMISSION DATE:  03/16/2018, CONSULTATION DATE:  04/08/2018 REFERRING MD: hospitalist , CHIEF COMPLAINT: shortness of breath   BRIEF HISTORY:    67 year old lady with history of non-small cell lung cancer newly diagnosed with metastasis to the brain coming in with shortness of breath admitted to the hospitalist service now in severe respiratory distress transferred to the ICU for intubation. 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  I was called early this morning to come and assess her which I came to assess immediately on the floor. Daughter who is a hospice nurse was at bedside.  I am not able to get any history from the patient since her altered mental status but as per daughter patient was just started on some pain medicine for her recent thoracentesis and right chest pain when she thought that most of her breathing issues were secondary to that patient was getting confused at home no fever no chills no rigors.  Patient had a thoracentesis and of November which was showing atypical cells.  MRI of the brain showed right thalamic 2 x 2 centimeter mass.    SIGNIFICANT PAST MEDICAL HISTORY   -Newly diagnosed non-small cell lung cancer with metastasis to the brain -Hypertension -History of cholecystectomy  SIGNIFICANT EVENTS:  Endotracheal intubation 04/02/2018  STUDIES:   CT head 03/12/2018 1. Motion degraded study. Small area of hypo attenuation in  the posterior right temporal lobe may be related to motion artifact although a small acute to subacute area of ischemia cannot be excluded. MRI of the brain could be used to further evaluate as clinically warranted. 2. The mass in the right thalamus is again noted with the abnormal soft tissue extending into the posterior aspect of the right lateral Ventricle.  CULTURES:  03/20/2018 chest two-point>> 04/01/2018 blood culture>> 03/19/2018 sputum culture>> ANTIBIOTICS:  Cefepime 03/20/2018 >> Vancomycin 03/20/2018>> 12/11 Flagyl 04/05/2018>>  LINES/TUBES:   ET tube 03/13/2018 >>  Right radial A-line 03/16/2018>>  right Wayne pneumothorax chest tube 03/28/2018>>  CONSULTANTS:   SUBJECTIVE:  Remains sedated.  100cc from chest tube last 24h PEEP and Fio2 stable.    CONSTITUTIONAL: BP 119/61   Pulse (!) 102   Temp 98.3 F (36.8 C) (Oral)   Resp (!) 38   Ht 5\' 3"  (1.6 m)   Wt 69.6 kg   SpO2 95%   BMI 27.18 kg/m    I/O last 3 completed shifts: In: 4520.2 [I.V.:2580.2; NG/GT:590; IV Piggyback:1350] Out: 1120 [Urine:650; Emesis/NG output:200; Chest Tube:270]  CVP:  [7 mmHg-16 mmHg] 11 mmHg  Vent Mode: PRVC FiO2 (%):  [60 %-80 %] 60 % Set Rate:  [33 bmp] 33 bmp Vt Set:  [310 mL] 310 mL PEEP:  [10 cmH20] 10 cmH20 Plateau Pressure:  [22 [id number] cmH20] 22 cmH20  PHYSICAL EXAM: General: ill appearing woman, ventilated Neuro: sedated, minimal response to voice.  HEENT:ETT in place, no oral lesions Cardiovascular: regular, no M Lungs: B rhonchi, R > L. R pigtail CT in place  Abdomen: Soft no tenderness no organomegaly Musculoskeletal: trace LE edema Skin: no rash   RESOLVED PROBLEM LIST   ASSESSMENT AND PLAN   Assessment:  Acute hypoxemic respiratory failure with bilateral pulmonary infiltrates consistent with ARDS; question cause, consider aspiration pneumonitis especially with suppressed mental status preceding admission Continue to wean FiO2 and PEEP as per ARDS  protocol.  Low tidal volume ventilation. Continue empiric antibiotics for presumed healthcare associated pneumonia. As needed bronchodilators   Shock.  Suspect septic shock but consider cardiogenic component, volume status, sedating medications.  Echocardiogram reassuring Continue vasopressor support with phenylephrine, goal wean.  Hopefully will be able to do so as we come down on her sedating medication. Would like to initiate diuresis if and when blood pressure will tolerate. Continue to follow CVP  Large right pleural effusion, presumed malignant Continue right chest tube drainage  Poorly differentiated non-small cell lung cancer with metastatic disease to brain, probably to the right pleura We will revisit treatment strategies once she stabilizes from this acute event  Hyponatremia.  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  Component of medications, hyponatremia.  No evidence for seizure activity noted Fentanyl and propofol as ordered, attempt to lighten on 12/11  Hypoglycemia Tube feedings initiated   SUMMARY OF TODAY'S PLAN:    Best Practice / Goals of Care / Disposition.   DVT PROPHYLAXIS:Lovenox SUP:PPI  NUTRITION:NPO/start tube feeding MOBILITY:bedrest  GOALS OF CARE:full code  FAMILY DISCUSSIONS: No family present on 12/11 DISPOSITION icu   LABS  Glucose Recent Labs  Lab 03/19/18 1656 03/19/18 1720 03/19/18 1951 03/19/18 2348 03/20/18 0350 03/20/18 0738  GLUCAP 67* 118* 98 81 100* 105*    BMET Recent Labs  Lab 03/19/18 0105 03/19/18 0448 03/20/18 0434  NA 124* 123* 127*  K 4.5 4.2 4.2  CL 84* 84* 90*  CO2 30 27 26   BUN 16 17 23   CREATININE 0.64 0.67 0.80  GLUCOSE 91 94 136*    Liver Enzymes Recent Labs  Lab 03/14/18 1016 04/04/2018 1532  AST 17 21  ALT 11 11  ALKPHOS 237* 262*  BILITOT 0.6 0.6  ALBUMIN 2.6* 2.6*     Electrolytes Recent Labs  Lab 04/06/2018 1458  03/19/18 0105 03/19/18 0448 03/20/18 0434  CALCIUM  --    < > 9.3 9.2 9.1  MG 1.8  --   --  1.8 2.2  PHOS  --   --   --  3.7 3.6   < > = values in this interval not displayed.    CBC Recent Labs  Lab 03/23/2018 0716 03/19/18 0448 03/20/18 0434  WBC 26.3* 21.4* 27.0*  HGB 13.2 12.5 12.7  HCT 39.9 39.2 40.2  PLT 361 326 294    ABG Recent Labs  Lab 03/19/18 0347 03/19/18 1205 03/20/18 0400  PHART 7.347* 7.326* 7.291*  PCO2ART 56.6* 54.6* 55.9*  PO2ART 260.0* 62.0* 133*    Coag's Recent Labs  Lab 04/03/2018 1532 04/07/2018 0716  INR 1.10 1.11    Sepsis Markers Recent Labs  Lab 03/30/2018 1532  03/27/2018 0225 04/01/2018 0716 03/19/18 1130  LATICACIDVEN  --    < > 3.7* 2.8* 2.0*  PROCALCITON 1.14  --   --   --   --    < > = values in this interval not displayed.    Cardiac Enzymes Recent Labs  Lab 04/04/2018 2005  TROPONINI <0.03  Independent critical care time 33 minutes  Baltazar Apo, MD, PhD 03/20/2018, 10:48 AM [place] [phone] or if no answer [phone]

## 2018-03-21 ENCOUNTER — Ambulatory Visit: Payer: Medicare Other

## 2018-03-21 ENCOUNTER — Other Ambulatory Visit (HOSPITAL_COMMUNITY): Payer: Medicare Other

## 2018-03-21 ENCOUNTER — Inpatient Hospital Stay (HOSPITAL_COMMUNITY): Payer: Medicare Other

## 2018-03-21 ENCOUNTER — Ambulatory Visit: Payer: Medicare Other | Admitting: Radiation Oncology

## 2018-03-21 ENCOUNTER — Ambulatory Visit (HOSPITAL_COMMUNITY): Payer: Medicare Other | Admitting: Hematology

## 2018-03-21 DIAGNOSIS — A419 Sepsis, unspecified organism: Principal | ICD-10-CM

## 2018-03-21 DIAGNOSIS — R6520 Severe sepsis without septic shock: Secondary | ICD-10-CM

## 2018-03-21 LAB — CBC
HEMATOCRIT: 42.1 % (ref 36.0–46.0)
Hemoglobin: 12.9 g/dL (ref 12.0–15.0)
MCH: 28.7 pg (ref 26.0–34.0)
MCHC: 30.6 g/dL (ref 30.0–36.0)
MCV: 93.6 fL (ref 80.0–100.0)
Platelets: 302 10*3/uL (ref 150–400)
RBC: 4.5 MIL/uL (ref 3.87–5.11)
RDW: 13 % (ref 11.5–15.5)
WBC: 30.8 10*3/uL — ABNORMAL HIGH (ref 4.0–10.5)
nRBC: 0 % (ref 0.0–0.2)

## 2018-03-21 LAB — BASIC METABOLIC PANEL
Anion gap: 13 (ref 5–15)
BUN: 34 mg/dL — ABNORMAL HIGH (ref 8–23)
CO2: 23 mmol/L (ref 22–32)
Calcium: 9 mg/dL (ref 8.9–10.3)
Chloride: 96 mmol/L — ABNORMAL LOW (ref 98–111)
Creatinine, Ser: 0.98 mg/dL (ref 0.44–1.00)
GFR calc Af Amer: >60 mL/min (ref >60)
GFR calc non Af Amer: >60 mL/min (ref >60)
Glucose, Bld: 133 mg/dL — ABNORMAL HIGH (ref 70–99)
Potassium: 4 mmol/L (ref 3.5–5.1)
Sodium: 132 mmol/L — ABNORMAL LOW (ref 135–145)

## 2018-03-21 LAB — POCT I-STAT 3, ART BLOOD GAS (G3+)
Acid-base deficit: 6 mmol/L — ABNORMAL HIGH (ref 0.0–2.0)
Bicarbonate: 22.8 mmol/L (ref 20.0–28.0)
O2 Saturation: 83 %
Patient temperature: 99.1
TCO2: 25 mmol/L (ref 22–32)
pCO2 arterial: 58.6 mmHg — ABNORMAL HIGH (ref 32.0–48.0)
pH, Arterial: 7.2 — ABNORMAL LOW (ref 7.350–7.450)
pO2, Arterial: 60 mmHg — ABNORMAL LOW (ref 83.0–108.0)

## 2018-03-21 LAB — GLUCOSE, CAPILLARY
Glucose-Capillary: 123 mg/dL — ABNORMAL HIGH (ref 70–99)
Glucose-Capillary: 115 mg/dL — ABNORMAL HIGH (ref 70–99)
Glucose-Capillary: 125 mg/dL — ABNORMAL HIGH (ref 70–99)
Glucose-Capillary: 127 mg/dL — ABNORMAL HIGH (ref 70–99)
Glucose-Capillary: 120 mg/dL — ABNORMAL HIGH (ref 70–99)
Glucose-Capillary: 125 mg/dL — ABNORMAL HIGH (ref 70–99)

## 2018-03-21 LAB — TRIGLYCERIDES: TRIGLYCERIDES: 172 mg/dL — AB (ref <150)

## 2018-03-21 NOTE — Progress Notes (Signed)
PULMONARY / CRITICAL CARE MEDICINE   NAME:  Glenda Jensen, MRN:  [id number], DOB:  [birth year], LOS: 4 ADMISSION DATE:  04/08/2018, CONSULTATION DATE:  03/13/2018 REFERRING MD: hospitalist , CHIEF COMPLAINT: shortness of breath   BRIEF HISTORY:    67 year old lady with history of non-small cell lung cancer newly diagnosed with metastasis to the brain coming in with shortness of breath admitted to the hospitalist service now in severe respiratory distress transferred to the ICU for intubation. 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  I was called early this morning to come and assess her which I came to assess immediately on the floor. Daughter who is a hospice nurse was at bedside.  I am not able to get any history from the patient since her altered mental status but as per daughter patient was just started on some pain medicine for her recent thoracentesis and right chest pain when she thought that most of her breathing issues were secondary to that patient was getting confused at home no fever no chills no rigors.  Patient had a thoracentesis and of November which was showing atypical cells.  MRI of the brain showed right thalamic 2 x 2 centimeter mass.    SIGNIFICANT PAST MEDICAL HISTORY   -Newly diagnosed non-small cell lung cancer with metastasis to the brain -Hypertension -History of cholecystectomy  SIGNIFICANT EVENTS:  Endotracheal intubation 04/07/2018  STUDIES:   CT head 03/15/2018 1. Motion degraded study. Small area of hypo attenuation in  the posterior right temporal lobe may be related to motion artifact although a small acute to subacute area of ischemia cannot be excluded. MRI of the brain could be used to further evaluate as clinically warranted. 2. The mass in the right thalamus is again noted with the abnormal soft tissue extending into the posterior aspect of the right lateral Ventricle.  CULTURES:  03/19/2018 chest two-point>> 04/01/2018 blood culture>> 03/19/2018 sputum culture>> ANTIBIOTICS:  Cefepime 03/12/2018 >> Vancomycin 03/13/2018>> 12/11 Flagyl 03/11/2018>>  LINES/TUBES:   ET tube 04/04/2018 >>  Right radial A-line 04/06/2018>>  right Wayne pneumothorax chest tube 04/07/2018>>  CONSULTANTS:   SUBJECTIVE:  Lightening sedation today Has tolerated some decrease in PEEP and FiO2 over the last 24 to 48 hours There was some concern about some duskiness in her right lower extremity given her femoral A-line.  CONSTITUTIONAL: BP 117/67   Pulse (!) 105   Temp (!) 100.6 F (38.1 C) (Oral)   Resp (!) 37   Ht 5\' 3"  (1.6 m)   Wt 72.2 kg   SpO2 (!) 85%   BMI 28.20 kg/m   I/O last 3 completed shifts: In: 6364.5 [I.V.:4159.3; NG/GT:1140; IV Piggyback:1065.3] Out: 800 [Urine:640; Chest Tube:160]  CVP:  [0 mmHg-13 mmHg] 0 mmHg  Vent Mode: PRVC FiO2 (%):  [50 %-70 %] 60 % Set Rate:  [33 bmp] 33 bmp Vt Set:  [310 mL] 310 mL PEEP:  [8 cmH20] 8 cmH20 Plateau Pressure:  [20 cmH20-26 cmH20] 25 cmH20  PHYSICAL EXAM: General: ill appearing woman, ventilated Neuro: sedated, minimal response to voice.  HEENT:ETT in place,  no oral lesions Cardiovascular: regular, no M Lungs: B rhonchi, R > L. R pigtail CT in place Abdomen: Soft no tenderness no organomegaly Musculoskeletal: trace LE edema Skin: no rash   RESOLVED PROBLEM LIST   ASSESSMENT AND PLAN   Assessment:  Acute hypoxemic respiratory failure with bilateral pulmonary infiltrates consistent with ARDS; question cause, consider aspiration  pneumonitis especially with suppressed mental status preceding admission Continue to wean FiO2 and PEEP per ARDS protocol as able.  Try to decrease to 50%, PEEP 5 on 12/12, see if she tolerates.  ABG to evaluate Continue empiric antibiotics, tailor to cefepime alone, stop metronidazole on 12/12 Bronchodilators as needed Chest tube as below   Shock.  Septic shock but consider cardiogenic component, volume status, sedating medications.  Echocardiogram reassuring Wean phenylephrine as able.  Hopefully will make more progress as her sedation is lightened Try to initiate gentle diuresis on 12/12.  Blood pressure may preclude Follow CVP  Large right pleural effusion, presumed malignant Continue right chest tube to suction.  Output has decreased, under 50 cc in the last 24 hours  Poorly differentiated non-small cell lung cancer with metastatic disease to brain, probably to the right pleura We will have to revisit treatment strategy if and when she recovers from this acute illness  Hyponatremia.  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  Component of medications, hyponatremia.  No evidence for seizure activity noted Propofol and fentanyl as ordered Daily wake up assessment, attempt to lighten as able  Hypoglycemia Continue tube feeding   SUMMARY OF TODAY'S PLAN:    Best Practice / Goals of Care / Disposition.   DVT PROPHYLAXIS:Lovenox SUP:PPI  NUTRITION: Tube feeding MOBILITY:bedrest  GOALS OF CARE:full code  FAMILY DISCUSSIONS: No family present on 12/12 DISPOSITION icu   LABS  Glucose Recent Labs  Lab 03/20/18 1223 03/20/18 1538 03/20/18 2105 03/20/18 2322 03/21/18 0405 03/21/18 0738  GLUCAP 124* 131* 133* 90 123* 115*    BMET Recent Labs  Lab 03/19/18 0448 03/20/18 0434 03/21/18 0405  NA 123* 127* 132*  K 4.2 4.2 4.0  CL 84* 90* 96*  CO2 27 26 23   BUN 17 23 34*   CREATININE 0.67 0.80 0.98  GLUCOSE 94 136* 133*    Liver Enzymes Recent Labs  Lab 04/07/2018 1532  AST 21  ALT 11  ALKPHOS 262*  BILITOT 0.6  ALBUMIN 2.6*    Electrolytes Recent Labs  Lab 04/09/2018 1458  03/19/18 0448 03/20/18 0434 03/21/18 0405  CALCIUM  --    < > 9.2 9.1 9.0  MG 1.8  --  1.8 2.2  --   PHOS  --   --  3.7 3.6  --    < > = values in this interval not displayed.    CBC Recent Labs  Lab 03/19/18 0448 03/20/18 0434 03/21/18 0405  WBC 21.4* 27.0* 30.8*  HGB 12.5 12.7 12.9  HCT 39.2 40.2 42.1  PLT 326 294 302    ABG Recent Labs  Lab 03/19/18 0347 03/19/18 1205 03/20/18 0400  PHART 7.347* 7.326* 7.291*  PCO2ART 56.6* 54.6* 55.9*  PO2ART 260.0* 62.0* 133*    Coag's Recent Labs  Lab 04/04/2018 1532 03/16/2018 0716  INR 1.10 1.11    Sepsis Markers Recent Labs  Lab 03/14/2018 1532  04/02/2018 0225 03/13/2018 0716 03/19/18 1130  LATICACIDVEN  --    < > 3.7* 2.8* 2.0*  PROCALCITON 1.14  --   --   --   --    < > =  values in this interval not displayed.    Cardiac Enzymes Recent Labs  Lab 03/13/2018 2005  TROPONINI <0.03     Independent critical care time 32 minutes  Baltazar Apo, MD, PhD 03/21/2018, 11:15 AM  [facility] [phone] or if no answer [phone]

## 2018-03-21 NOTE — Progress Notes (Signed)
Called elink, informed Dr James Ivanoff two rns confirm unable to doppler right dorsalis and popliteal pulse, rtght foot cyanoic cool and dusky, camera visual done by md. md will send ground team to bedside for assessment.

## 2018-03-21 NOTE — Progress Notes (Signed)
Eddie Dibbles, NP and CCM md to bedside. Md able to doppler pulse right foot. Md and Eddie Dibbles states to maintain right femoral a-line and central line and to continue neosenephrine drip via central line. md states no concern for vascular occlusion. Will continue to monitor vascular extremities.

## 2018-03-21 NOTE — Progress Notes (Signed)
Attempting to wean down sedation, pressor, vent support this AM. ABG drawn 35 min after peep down to 5 and FiO2 down to 50%. As gas being run, pt decompensated with refractory high peak airway pressures, breath stacking, precipitous decrease in sats to 50's. Began bagging pt immediately, finding it difficult to deliver breath: bolus of sedation given with subsequent easing of delivering breaths. Dr. Lamonte Sakai, RT at bs immediately. Pt recovered with normalizing of sat within minute. ABG result reflective of status immediately before precipitous decompensation.

## 2018-03-21 NOTE — Progress Notes (Addendum)
OVERNIGHT COVERAGE CRITICAL CARE PROGRESS NOTE  Called to see patient re: request for new vascular access despite right femoral arterial and central venous lines due to concern for right foot ischemia.  On examination, the patient does have a slightly dusky although not mottled right foot.  It is cool to the touch from the level of the ankle and inferiorly; however, dorsalis pedis pulse was palpable and verified by Doppler.  I would not change the lines at this time, in the absence of other concerns for infection/concerns.  Nonetheless, continue to monitor closely.  Renee Pain, MD Board Certified by the ABIM, [place]

## 2018-03-21 NOTE — Progress Notes (Signed)
Brain and Spine Tumor Board Documentation  Glenda Jensen was presented by Cecil Cobbs, MD at Brain and Spine Tumor Board on 03/21/2018, which included representatives from neuro oncology, radiation oncology, surgical oncology, radiology, pathology, navigation.  Glenda Jensen was presented as a current patient with history of the following treatments:  .  Additionally, we reviewed previous medical and familial history, history of present illness, and recent lab results along with all available histopathologic and imaging studies. The tumor board considered available treatment options and made the following recommendations:  Radiation therapy (primary modality) likely [facility]  Tumor board is a meeting of clinicians from various specialty areas who evaluate and discuss patients for whom a multidisciplinary approach is being considered. Final determinations in the plan of care are those of the provider(s). The responsibility for follow up of recommendations given during tumor board is that of the provider.   Today's extended care, comprehensive team conference, Glenda Jensen was not present for the discussion and was not examined.

## 2018-03-21 NOTE — Progress Notes (Signed)
[facility] Progress Note Patient Name: Glenda Jensen DOB: 15-Oct-1950 MRN: [id number]   Date of Service  03/21/2018  HPI/Events of Note  Informed by bedside ICU RN that pulses are not palpated on the right lower extremity.  She has femoral TLC and A line.    High dose of neosynephrine infusing in the R TLC.    eICU Interventions  Advised RN to transfer to peripheral access while new TLC will be reinserted.       Intervention Category Major Interventions: Other:  Elsie Lincoln 03/21/2018, 12:06 AM

## 2018-03-22 ENCOUNTER — Inpatient Hospital Stay (HOSPITAL_COMMUNITY): Payer: Medicare Other

## 2018-03-22 DIAGNOSIS — N170 Acute kidney failure with tubular necrosis: Secondary | ICD-10-CM

## 2018-03-22 LAB — BASIC METABOLIC PANEL
Anion gap: 14 (ref 5–15)
BUN: 54 mg/dL — ABNORMAL HIGH (ref 8–23)
CO2: 23 mmol/L (ref 22–32)
Calcium: 9.1 mg/dL (ref 8.9–10.3)
Chloride: 98 mmol/L (ref 98–111)
Creatinine, Ser: 1.6 mg/dL — ABNORMAL HIGH (ref 0.44–1.00)
GFR calc non Af Amer: 33 mL/min — ABNORMAL LOW (ref >60)
GFR, EST AFRICAN AMERICAN: 39 mL/min — AB (ref >60)
Glucose, Bld: 149 mg/dL — ABNORMAL HIGH (ref 70–99)
Potassium: 4.3 mmol/L (ref 3.5–5.1)
Sodium: 135 mmol/L (ref 135–145)

## 2018-03-22 LAB — BODY FLUID CULTURE: Culture: NO GROWTH

## 2018-03-22 LAB — GLUCOSE, CAPILLARY
Glucose-Capillary: 120 mg/dL — ABNORMAL HIGH (ref 70–99)
Glucose-Capillary: 121 mg/dL — ABNORMAL HIGH (ref 70–99)
Glucose-Capillary: 128 mg/dL — ABNORMAL HIGH (ref 70–99)
Glucose-Capillary: 137 mg/dL — ABNORMAL HIGH (ref 70–99)
Glucose-Capillary: 138 mg/dL — ABNORMAL HIGH (ref 70–99)
Glucose-Capillary: 143 mg/dL — ABNORMAL HIGH (ref 70–99)

## 2018-03-22 LAB — CULTURE, BLOOD (ROUTINE X 2)
Culture: NO GROWTH
Culture: NO GROWTH
Special Requests: ADEQUATE
Special Requests: ADEQUATE

## 2018-03-22 LAB — MAGNESIUM: Magnesium: 2.3 mg/dL (ref 1.7–2.4)

## 2018-03-22 MED ORDER — SODIUM CHLORIDE 0.9% FLUSH: 10.0000 mL | INTRAVENOUS | Status: DC | PRN

## 2018-03-22 MED ORDER — CHLORHEXIDINE GLUCONATE CLOTH 2 % EX PADS
6.0000 | MEDICATED_PAD | Freq: Every day | CUTANEOUS | Status: DC
  Administered 2018-03-22 – 2018-03-23 (×2): 6 via TOPICAL

## 2018-03-22 MED ORDER — SODIUM CHLORIDE 0.9% FLUSH
10.0000 mL | Freq: Two times a day (BID) | INTRAVENOUS | Status: DC
  Administered 2018-03-22 (×2): 10 mL

## 2018-03-22 MED ORDER — SODIUM CHLORIDE 0.9 % IV SOLN
1.0000 g | INTRAVENOUS | Status: DC
  Administered 2018-03-23 – 2018-03-24 (×2): 1 g via INTRAVENOUS
  Filled 2018-03-22 (×3): qty 1

## 2018-03-22 MED ORDER — ONDANSETRON HCL 4 MG/2ML IJ SOLN
4.0000 mg | Freq: Three times a day (TID) | INTRAMUSCULAR | Status: DC | PRN
  Administered 2018-03-22: 4 mg via INTRAVENOUS
  Filled 2018-03-22: qty 2

## 2018-03-22 MED ORDER — FUROSEMIDE 10 MG/ML IJ SOLN
80.0000 mg | Freq: Two times a day (BID) | INTRAMUSCULAR | Status: AC
  Administered 2018-03-22 – 2018-03-23 (×2): 80 mg via INTRAVENOUS
  Filled 2018-03-22 (×2): qty 8

## 2018-03-22 MED ORDER — BISACODYL 10 MG RE SUPP
10.0000 mg | Freq: Every day | RECTAL | Status: DC | PRN
  Administered 2018-03-22: 10 mg via RECTAL
  Filled 2018-03-22: qty 1

## 2018-03-22 NOTE — Progress Notes (Signed)
Pt continues to have coffee ground/ dark substance coming from mouth. Particularly noticed when patient is being turned. Tube feeds have ben off since 0043.  Margaret Pyle, RN

## 2018-03-22 NOTE — Progress Notes (Signed)
Pharmacy Antibiotic Note  Khadejah Son is a 67 y.o. female admitted on 04/02/2018 with chest pain and SOB.  Pharmacy has been consulted for cefepime dosing for RLL PNA - CXR - incr R pleural effusion, ARDS unchanged.  Renal function is worsening, afebrile, WBC up to 30.8, LA 3.7, PCT 1.14.   Plan: Change cefepime to 1gm IV Q24H Monitor renal fxn, clinical progress, abx LOT    Height: 5\' 3"  (160 cm) Weight: 167 lb 1.7 oz (75.8 kg)(with prevalon boots on) IBW/kg (Calculated) : 52.4  Temp (24hrs), Avg:98.9 F (37.2 C), Min:96.6 F (35.9 C), Max:99.9 F (37.7 C)  Recent Labs  Lab 04/03/2018 1532 04/03/2018 1618 03/16/2018 1843 03/30/2018 0225  03/10/2018 0716  03/19/18 0105 03/19/18 0448 03/19/18 1130 03/20/18 0434 03/21/18 0405 03/22/18 0334  WBC 30.3*  --   --   --   --  26.3*  --   --  21.4*  --  27.0* 30.8*  --   CREATININE 0.53  --   --   --    < > 0.67   < > 0.64 0.67  --  0.80 0.98 1.60*  LATICACIDVEN  --  3.6* 2.8* 3.7*  --  2.8*  --   --   --  2.0*  --   --   --    < > = values in this interval not displayed.    Estimated Creatinine Clearance: 33.7 mL/min (A) (by C-G formula based on SCr of 1.6 mg/dL (H)).    No Known Allergies    Vanc 12/8 >> 12/11 Cefepime 12/8 >> Flagyl 12/8 >>  12/8 BCx - negative 12/9 pleural fluid - NGTD 12/9 MRSA PCR - negative   Marwah Disbro D. Mina Marble, PharmD, BCPS, [place] 03/22/2018, 9:03 AM

## 2018-03-22 NOTE — Progress Notes (Signed)
Pt has a dark like substance coming from mouth. Not present in in-line suction or subglottic. Substance smells like vomit or stomach content. Tube feeds turned off. Possible coffee ground emesis/stomach content. Will restart tube feeds within the next two hours if no more signs of vomit.  Margaret Pyle, RN

## 2018-03-22 NOTE — Progress Notes (Signed)
PULMONARY / CRITICAL CARE MEDICINE   NAME:  Glenda Jensen, MRN:  [id number], DOB:  12/14/1950, LOS: 5 ADMISSION DATE:  04/02/2018, CONSULTATION DATE:  03/26/2018 REFERRING MD: hospitalist , CHIEF COMPLAINT: shortness of breath   BRIEF HISTORY:    67 year old lady with history of non-small cell lung cancer newly diagnosed with metastasis to the brain coming in with shortness of breath admitted to the hospitalist service now in severe respiratory distress transferred to the ICU for intubation. 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  I was called early this morning to come and assess her which I came to assess immediately on the floor. Daughter who is a hospice nurse was at bedside.  I am not able to get any history from the patient since her altered mental status but as per daughter patient was just started on some pain medicine for her recent thoracentesis and right chest pain when she thought that most of her breathing issues were secondary to that patient was getting confused at home no fever no chills no rigors.  Patient had a thoracentesis and of November which was showing atypical cells.  MRI of the brain showed right thalamic 2 x 2 centimeter mass.    SIGNIFICANT PAST MEDICAL HISTORY   -Newly diagnosed non-small cell lung cancer with metastasis to the brain -Hypertension -History of cholecystectomy  SIGNIFICANT EVENTS:  Endotracheal intubation 03/14/2018  STUDIES:   CT head 03/22/2018 1. Motion degraded study. Small area of hypo attenuation in  the posterior right temporal lobe may be related to motion artifact although a small acute to subacute area of ischemia cannot be excluded. MRI of the brain could be used to further evaluate as clinically warranted. 2. The mass in the right thalamus is again noted with the abnormal soft tissue extending into the posterior aspect of the right lateral Ventricle.  CULTURES:  03/22/2018 chest two-point>> 03/27/2018 blood culture>> 03/19/2018 sputum culture>>  ANTIBIOTICS:  Cefepime 04/03/2018 >> Vancomycin 03/14/2018>> 12/11 Flagyl 04/07/2018>>  LINES/TUBES:   ET tube 03/26/2018 >>  Right radial A-line 03/10/2018>>  right Wayne pneumothorax chest tube 03/11/2018>>  CONSULTANTS:   SUBJECTIVE:  FiO2 60, PEEP 8 Remains heavily sedated, had some desaturation and increased vent needs on 12/12 when her sedation was lightened. Chest tube output has decreased, 40 cc yesterday Dropping UOP, worsening renal fxn Increased pressor needs Emesis, TF to off Low grade fever  CONSTITUTIONAL: BP (!) 93/48 (BP Location: Left Arm)   Pulse 99   Temp (!) 101 F (38.3 C) (Oral) Comment: MD aware  Resp (!) 37   Ht 5\' 3"  (1.6 m)   Wt 75.8 kg Comment: with prevalon boots on  SpO2 98%   BMI 29.60 kg/m   I/O last 3 completed shifts: In: 20837.1 [I.V.:4566.2; Other:50; NG/GT:1751.5; IV Piggyback:14469.4] Out: 455 [Urine:415; Chest Tube:40]  CVP:  [19 mmHg-20 mmHg] 19 mmHg  Vent Mode: PRVC FiO2 (%):  [50 %-60 %] 50 % Set Rate:  [30 bmp-33 bmp] 33 bmp Vt Set:  [310 mL] 310 mL PEEP:  [5 cmH20-8 cmH20] 8 cmH20 Plateau Pressure:  [  23 cmH20-25 cmH20] 25 cmH20  PHYSICAL EXAM: General: Ill-appearing woman, obese, ventilated Neuro: Sedated, some grimace with pain, does not follow commands, does have spontaneous respiratory drive HEENT: ET tube in place, no oral lesions, pupils equal Cardiovascular: Regular, no murmur Lungs: Bilateral rhonchi without wheezing.  More so on the right than on the left.   There is a right chest tube in place that is to suction Abdomen: distended, tense, positive bowel sounds Musculoskeletal: Trace lower extremity edema Skin: No rash   RESOLVED PROBLEM LIST   ASSESSMENT AND PLAN   Assessment:  Acute hypoxemic respiratory failure with bilateral pulmonary infiltrates consistent with ARDS; question cause, consider aspiration pneumonitis especially with suppressed mental status preceding admission Continue to work on decreasing PEEP and FiO2 as per ARDS protocol.  Follow ABG intermittently Continue empiric antibiotics, currently cefepime alone Bronchodilators as needed Chest tube to suction as below   Shock.  Septic shock but consider cardiogenic component, volume status, sedating medications.  Echocardiogram reassuring Currently on high-dose phenylephrine 225.  Work to wean.  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  Consider change to waterseal soon.  I do not want to remove it right now with large fluid shifts and the potential for reaccumulated effusion  Acute renal failure, likely ATN due to hypoperfusion, shock Follow urine output, BMP Try empiric high dose lasix to attempt to jump start renal fxn Likely a poor candidate for hemodialysis should she have overt failure.  I will need to discuss with her family  Abdominal distention, emesis Probable ileus.  Check KUB.  No bowel movement, will try to initiate a bowel regimen  Poorly differentiated non-small cell lung cancer with metastatic disease to brain, probably to the right pleura Will plan to revisit treatment strategy if and when she recovers from this acute illness  Hyponatremia.  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  Component of medications, hyponatremia.  No evidence for seizure activity noted Propofol and fentanyl as ordered Daily wake up assessment, overall goal to attempt to lighten sedation as able  Hypoglycemia Tube feeding held 12/13   SUMMARY OF TODAY'S PLAN:    Best Practice / Goals of Care / Disposition.   DVT PROPHYLAXIS:Lovenox SUP:PPI  NUTRITION: Tube feeding MOBILITY:bedrest  GOALS OF CARE:full code  FAMILY DISCUSSIONS: discussed with family at bedside 12/13 DISPOSITION icu   LABS  Glucose Recent Labs  Lab 03/21/18 1119 03/21/18 1519 03/21/18 1955 03/21/18 2328 03/22/18 0405 03/22/18 0737  GLUCAP 125* 127* 120* 125* 138* 121*    BMET Recent Labs  Lab 03/20/18 0434 03/21/18 0405 03/22/18 0334  NA 127* 132* 135  K 4.2 4.0 4.3  CL 90* 96* 98  CO2 26 23 23   BUN 23 34* 54*  CREATININE 0.80 0.98 1.60*  GLUCOSE 136* 133* 149*    Liver Enzymes Recent Labs  Lab 03/25/2018 1532  AST 21  ALT 11  ALKPHOS 262*  BILITOT 0.6  ALBUMIN 2.6*    Electrolytes Recent Labs  Lab 03/19/18 0448 03/20/18 0434 03/21/18 0405 03/22/18 0334  CALCIUM 9.2 9.1 9.0 9.1  MG 1.8 2.2  --  2.3  PHOS 3.7 3.6  --   --  CBC Recent Labs  Lab 03/19/18 0448 03/20/18 0434 03/21/18 0405  WBC 21.4* 27.0* 30.8*  HGB 12.5 12.7 12.9  HCT 39.2 40.2 42.1  PLT 326 294 302    ABG Recent Labs  Lab 03/19/18 1205 03/20/18 0400 03/21/18 1144  PHART 7.326* 7.291* 7.200*  PCO2ART 54.6* 55.9* 58.6*  PO2ART 62.0* 133* 60.0*    Coag's Recent Labs  Lab 04/07/2018 1532 04/05/2018 0716  INR 1.10 1.11    Sepsis Markers Recent Labs  Lab 04/03/2018 1532  03/30/2018 0225 03/21/2018 0716 03/19/18 1130  LATICACIDVEN  --    < > 3.7* 2.8* 2.0*  PROCALCITON 1.14  --   --   --   --    < > = values in this interval not displayed.    Cardiac Enzymes Recent Labs  Lab 04/02/2018 2005  TROPONINI <0.03      Independent critical care time 34 minutes  Baltazar Apo, MD, PhD 03/22/2018, 11:05 AM [facility] [phone] or if no answer [phone]

## 2018-03-22 NOTE — Progress Notes (Signed)
[place] Progress Note Patient Name: Glenda Jensen DOB: 01/19/1951 MRN: [id number]   Date of Service  03/22/2018  HPI/Events of Note  Notified of patient vomiting, TF on hold.  QTc 416  eICU Interventions  Zofran IV q 8 prn ordered     Intervention Category Minor Interventions: Other:  Judd Lien 03/22/2018, 6:03 AM

## 2018-03-23 LAB — GLUCOSE, CAPILLARY
Glucose-Capillary: 104 mg/dL — ABNORMAL HIGH (ref 70–99)
Glucose-Capillary: 118 mg/dL — ABNORMAL HIGH (ref 70–99)
Glucose-Capillary: 128 mg/dL — ABNORMAL HIGH (ref 70–99)
Glucose-Capillary: 132 mg/dL — ABNORMAL HIGH (ref 70–99)
Glucose-Capillary: 147 mg/dL — ABNORMAL HIGH (ref 70–99)
Glucose-Capillary: 154 mg/dL — ABNORMAL HIGH (ref 70–99)

## 2018-03-23 LAB — BASIC METABOLIC PANEL
Anion gap: 14 (ref 5–15)
BUN: 75 mg/dL — ABNORMAL HIGH (ref 8–23)
CO2: 19 mmol/L — ABNORMAL LOW (ref 22–32)
Calcium: 8.6 mg/dL — ABNORMAL LOW (ref 8.9–10.3)
Chloride: 103 mmol/L (ref 98–111)
Creatinine, Ser: 2.4 mg/dL — ABNORMAL HIGH (ref 0.44–1.00)
GFR calc non Af Amer: 20 mL/min — ABNORMAL LOW (ref >60)
GFR, EST AFRICAN AMERICAN: 24 mL/min — AB (ref >60)
Glucose, Bld: 152 mg/dL — ABNORMAL HIGH (ref 70–99)
Potassium: 4.8 mmol/L (ref 3.5–5.1)
Sodium: 136 mmol/L (ref 135–145)

## 2018-03-23 MED ORDER — NOREPINEPHRINE 4 MG/250ML-% IV SOLN
2.0000 ug/min | INTRAVENOUS | Status: DC
  Administered 2018-03-23 (×2): 10 ug/min via INTRAVENOUS
  Administered 2018-03-24 (×2): 13 ug/min via INTRAVENOUS
  Filled 2018-03-23 (×4): qty 250

## 2018-03-23 MED ORDER — ACETAMINOPHEN 160 MG/5ML PO SOLN
650.0000 mg | Freq: Four times a day (QID) | ORAL | Status: DC | PRN
  Administered 2018-03-23: 650 mg
  Filled 2018-03-23 (×2): qty 20.3

## 2018-03-23 MED ORDER — ENOXAPARIN SODIUM 30 MG/0.3ML ~~LOC~~ SOLN
30.0000 mg | SUBCUTANEOUS | Status: DC
  Administered 2018-03-24: 30 mg via SUBCUTANEOUS
  Filled 2018-03-23 (×3): qty 0.3

## 2018-03-23 NOTE — Progress Notes (Signed)
PULMONARY / CRITICAL CARE MEDICINE   NAME:  Glenda Jensen, MRN:  [id number], DOB:  [birth year], LOS: 6 ADMISSION DATE:  03/27/2018, CONSULTATION DATE:  03/15/2018 REFERRING MD: hospitalist , CHIEF COMPLAINT: shortness of breath   BRIEF HISTORY:    67 year old lady with history of non-small cell lung cancer newly diagnosed with metastasis to the brain coming in with shortness of breath admitted to the hospitalist service now in severe respiratory distress transferred to the ICU for intubation. 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  I was called early this morning to come and assess her which I came to assess immediately on the floor. Daughter who is a hospice nurse was at bedside.  I am not able to get any history from the patient since her altered mental status but as per daughter patient was just started on some pain medicine for her recent thoracentesis and right chest pain when she thought that most of her breathing issues were secondary to that patient was getting confused at home no fever no chills no rigors.  Patient had a thoracentesis and of November which was showing atypical cells.  MRI of the brain showed right thalamic 2 x 2 centimeter mass.    SIGNIFICANT PAST MEDICAL HISTORY   -Newly diagnosed non-small cell lung cancer with metastasis to the brain -Hypertension -History of cholecystectomy  SIGNIFICANT EVENTS:  Endotracheal intubation 04/05/2018  STUDIES:   CT head 04/05/2018 1. Motion degraded study. Small area of hypo attenuation in  the posterior right temporal lobe may be related to motion artifact although a small acute to subacute area of ischemia cannot be excluded. MRI of the brain could be used to further evaluate as clinically warranted. 2. The mass in the right thalamus is again noted with the abnormal soft tissue extending into the posterior aspect of the right lateral Ventricle.  CULTURES:  03/14/2018 chest two-point>> 03/10/2018 blood culture>> 03/19/2018 sputum culture>>  ANTIBIOTICS:  Cefepime 03/27/2018 >> Vancomycin 03/31/2018>> 12/11 Flagyl 03/29/2018>>  LINES/TUBES:   ET tube 03/10/2018 >>  Right radial A-line 03/28/2018>>  right Wayne pneumothorax chest tube 04/07/2018>>  CONSULTANTS:   SUBJECTIVE:  FiO2 40, PEEP 8 Heavy sedation BMP still pending this morning No increase in urine output, received Lasix x2 Phenylephrine needs are stable, 290  CONSTITUTIONAL: BP (!) 72/52   Pulse (!) 115   Temp 99 F (37.2 C) (Oral)   Resp (!) 40   Ht 5\' 3"  (1.6 m)   Wt 81.1 kg   SpO2 96%   BMI 31.67 kg/m   I/O last 3 completed shifts: In: 5807.1 [I.V.:4690.6; Other:40; NG/GT:876.5; IV Piggyback:200] Out: 165 [Urine:165]     Vent Mode: PRVC FiO2 (%):  [40 %-50 %] 40 % Set Rate:  [33 bmp] 33 bmp Vt Set:  [310 mL] 310 mL PEEP:  [8 cmH20] 8 cmH20 Plateau Pressure:  [28 [id number] cmH20] 28 cmH20  PHYSICAL EXAM: General: Ill-appearing woman, obese, ventilated Neuro: Heavily sedated, grimace with pain, does not follow commands HEENT: ET tube in place, pupils equal, no oral lesions Cardiovascular: Regular, heart rate 90s, no  murmur Lungs: Bilateral rhonchi, more clear on the left than on the right.  Right-sided chest tube in place and to suction Abdomen: Distended, soft bowel sounds Musculoskeletal: Trace lower extremity edema Skin: No rash   RESOLVED PROBLEM LIST   ASSESSMENT AND PLAN   Assessment:  Acute hypoxemic respiratory failure with bilateral pulmonary infiltrates consistent  with ARDS; question cause, consider aspiration pneumonitis especially with suppressed mental status preceding admission Some progress with weaning FiO2, PEEP remains 8.  Continue to try to wean as able per ARDS protocol.  Follow ABG intermittently Continue empiric cefepime, antibiotics day #7 of 8 Bronchodilators as needed next line continue chest tube to -20 cm suction   Shock.  Septic shock but consider cardiogenic component, volume status, sedating medications.  Echocardiogram reassuring Remains on high-dose phenylephrine, pressor needs slowly worsening.  Volume status positive Following CVP  Large right pleural effusion, presumed malignant Continue chest tube to suction  Acute renal failure, likely ATN due to hypoperfusion, shock Progressive metabolic acidosis due to the above Follow urine output, BMP Attempted to initiate a diuresis 12/13 with Lasix, ineffective. check lactate She is a poor candidate for hemodialysis should she have overt renal failure.  We will continue to discuss with her family.  Abdominal distention, emesis Probable ileus.  Tube feeding on hold KUB from 12/13 reviewed, no evidence of obstruction  Poorly differentiated non-small cell lung cancer with metastatic disease to brain, probably to the right pleura We can revisit treatment strategy if she recovers from this acute illness.  Hyponatremia.  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  Component of medications, hyponatremia.  No evidence for seizure activity noted Continue sedation with propofol and fentanyl Daily wake up assessment.  Her respiratory improvement may make it possible to lighten sedation in the next 24 hours  Hypoglycemia Tube feeding held 12/13.  Not in a position to restart 12/14   SUMMARY OF TODAY'S PLAN:    Best Practice / Goals of Care / Disposition.   DVT PROPHYLAXIS:Lovenox SUP:PPI  NUTRITION:  Tube feeding MOBILITY:bedrest  GOALS OF CARE:full code  FAMILY DISCUSSIONS: discussed with family at bedside 12/13, no family available 12/14 a.m. DISPOSITION icu   LABS  Glucose Recent Labs  Lab 03/22/18 1140 03/22/18 1502 03/22/18 1920 03/22/18 2336 03/23/18 0401 03/23/18 0806  GLUCAP 143* 137* 128* 120* 132* 118*    BMET Recent Labs  Lab 03/20/18 0434 03/21/18 0405 03/22/18 0334  NA 127* 132* 135  K 4.2 4.0 4.3  CL 90* 96* 98  CO2 26 23 23   BUN 23 34* 54*  CREATININE 0.80 0.98 1.60*  GLUCOSE 136* 133* 149*    Liver Enzymes Recent Labs  Lab 03/29/2018 1532  AST 21  ALT 11  ALKPHOS 262*  BILITOT 0.6  ALBUMIN 2.6*    Electrolytes Recent Labs  Lab 03/19/18 0448 03/20/18 0434 03/21/18 0405 03/22/18 0334  CALCIUM 9.2 9.1 9.0 9.1  MG 1.8 2.2  --  2.3  PHOS 3.7 3.6  --   --     CBC Recent Labs  Lab 03/19/18 0448 03/20/18 0434 03/21/18 0405  WBC 21.4* 27.0* 30.8*  HGB 12.5 12.7 12.9  HCT 39.2 40.2 42.1  PLT 326 294 302    ABG Recent Labs  Lab 03/19/18 1205 03/20/18 0400 03/21/18 1144  PHART 7.326* 7.291* 7.200*  PCO2ART 54.6* 55.9* 58.6*  PO2ART 62.0* 133* 60.0*    Coag's Recent Labs  Lab 03/31/2018 1532 04/04/2018  0716  INR 1.10 1.11    Sepsis Markers Recent Labs  Lab 04/01/2018 1532  03/14/2018 0225 04/01/2018 0716 03/19/18 1130  LATICACIDVEN  --    < > 3.7* 2.8* 2.0*  PROCALCITON 1.14  --   --   --   --    < > = values in this interval not displayed.    Cardiac Enzymes Recent Labs  Lab 03/20/2018 2005  TROPONINI <0.03     Independent critical care time 33 minutes  Baltazar Apo, MD, PhD 03/23/2018, 9:05 AM [facility] [phone] or if no answer [phone]

## 2018-03-23 NOTE — Progress Notes (Signed)
eLink Physician-Brief Progress Note Patient Name: Glenda Jensen DOB: 09-18-1950 MRN: [id number]   Date of Service  03/23/2018  HPI/Events of Note  Notified of fever.  Patient on cefepime, blood cultures NGTD  eICU Interventions  Tylenol 650 q 6 prn ordered     Intervention Category Intermediate Interventions: Infection - evaluation and management  Judd Lien 03/23/2018, 12:49 AM

## 2018-03-24 ENCOUNTER — Inpatient Hospital Stay (HOSPITAL_COMMUNITY): Payer: Medicare Other

## 2018-03-24 LAB — BASIC METABOLIC PANEL
Anion gap: 17 — ABNORMAL HIGH (ref 5–15)
BUN: 92 mg/dL — ABNORMAL HIGH (ref 8–23)
CO2: 19 mmol/L — ABNORMAL LOW (ref 22–32)
CREATININE: 2.81 mg/dL — AB (ref 0.44–1.00)
Calcium: 9 mg/dL (ref 8.9–10.3)
Chloride: 99 mmol/L (ref 98–111)
GFR calc Af Amer: 19 mL/min — ABNORMAL LOW (ref >60)
GFR, EST NON AFRICAN AMERICAN: 17 mL/min — AB (ref >60)
Glucose, Bld: 153 mg/dL — ABNORMAL HIGH (ref 70–99)
Potassium: 5 mmol/L (ref 3.5–5.1)
Sodium: 135 mmol/L (ref 135–145)

## 2018-03-24 LAB — BLOOD GAS, ARTERIAL
Acid-base deficit: 6.2 mmol/L — ABNORMAL HIGH (ref 0.0–2.0)
Bicarbonate: 19.6 mmol/L — ABNORMAL LOW (ref 20.0–28.0)
Drawn by: 10006
FIO2: 40
MECHVT: 310 mL
O2 Saturation: 95.4 %
PEEP: 8 cmH2O
Patient temperature: 99.3
RATE: 33 resp/min
pCO2 arterial: 46 mmHg (ref 32.0–48.0)
pH, Arterial: 7.256 — ABNORMAL LOW (ref 7.350–7.450)
pO2, Arterial: 79.5 mmHg — ABNORMAL LOW (ref 83.0–108.0)

## 2018-03-24 LAB — CBC
HCT: 37.4 % (ref 36.0–46.0)
Hemoglobin: 11.3 g/dL — ABNORMAL LOW (ref 12.0–15.0)
MCH: 28.5 pg (ref 26.0–34.0)
MCHC: 30.2 g/dL (ref 30.0–36.0)
MCV: 94.2 fL (ref 80.0–100.0)
PLATELETS: 293 10*3/uL (ref 150–400)
RBC: 3.97 MIL/uL (ref 3.87–5.11)
RDW: 14.2 % (ref 11.5–15.5)
WBC: 35.9 10*3/uL — ABNORMAL HIGH (ref 4.0–10.5)
nRBC: 0.1 % (ref 0.0–0.2)

## 2018-03-24 LAB — GLUCOSE, CAPILLARY
GLUCOSE-CAPILLARY: 135 mg/dL — AB (ref 70–99)
Glucose-Capillary: 138 mg/dL — ABNORMAL HIGH (ref 70–99)
Glucose-Capillary: 129 mg/dL — ABNORMAL HIGH (ref 70–99)

## 2018-03-24 LAB — TRIGLYCERIDES: Triglycerides: 462 mg/dL — ABNORMAL HIGH (ref <150)

## 2018-03-24 LAB — MAGNESIUM: Magnesium: 2.3 mg/dL (ref 1.7–2.4)

## 2018-03-24 LAB — LACTIC ACID, PLASMA: Lactic Acid, Venous: 2.8 mmol/L (ref 0.5–1.9)

## 2018-03-24 MED ORDER — CHLORHEXIDINE GLUCONATE 0.12 % MT SOLN
OROMUCOSAL | Status: AC
  Filled 2018-03-24: qty 15

## 2018-03-24 MED ORDER — POLYVINYL ALCOHOL 1.4 % OP SOLN
1.0000 [drp] | Freq: Four times a day (QID) | OPHTHALMIC | Status: DC | PRN
  Filled 2018-03-24: qty 15

## 2018-03-25 ENCOUNTER — Ambulatory Visit: Payer: Medicare Other

## 2018-03-25 ENCOUNTER — Ambulatory Visit: Payer: Medicare Other | Admitting: Radiation Oncology

## 2018-03-27 ENCOUNTER — Ambulatory Visit: Payer: Medicare Other | Admitting: Radiation Oncology

## 2018-03-27 ENCOUNTER — Telehealth: Payer: Self-pay | Admitting: *Deleted

## 2018-03-27 NOTE — Telephone Encounter (Signed)
Received D/C from [facility] -D/C forwarded to Dr.Byrum to sign .

## 2018-03-29 NOTE — Telephone Encounter (Signed)
Received D/C-Copy of D/C was faxed to [facility]

## 2018-04-10 NOTE — Progress Notes (Signed)
Family has verbalized that they will want to discontinue vent support this evening and transition to strictly comfort care. They are allowing time for extended family to visit with patient. Dr. Lamonte Sakai informed and await orders for comfort care to b enacted when family is ready.

## 2018-04-10 NOTE — Progress Notes (Signed)
Patient extubated at 1755, pt appearing in no distress. Family to bs immediately. VS deteriorated quickly and patient passed at [place], death being confirmed by absence of respirations and heart beat with Delphia Grates at bs. Emotional support given to family.

## 2018-04-10 NOTE — Progress Notes (Signed)
PCCM Interval Note  I met with the patient's family, 3 daughters and a family friend, along with Dr. Clementeen Graham with oncology.  Dr. Clementeen Graham had worked to explain the patient's underlying malignancy, the barriers to therapy, the potential management strategy were she to somehow survive this acute illness.  I explained to them the clinical changes over the last week on maximal support including mechanical ventilation, pressors, broad-spectrum antibiotics.  She has worsening shock and evolving metabolic acidosis, renal failure and I explained this as well.  They appropriately asked about whether I felt that CVVHD was an option, could potentially enhance her chances for an overall recovery.  I told him honestly that I do not believe any intervention is going to change her prognosis and that she is likely going to die despite any interventions we make.  This was difficult but they understood and were accepting.   Based on the above we talked about possible transition to comfort care, withdrawal of mechanical ventilation when the family is ready.  They are going to talk about this and let me know.  In the meantime I recommended that the patient should not undergo CPR should she decompensate further.  They agree with this and I will change her CODE STATUS to reflect it.  Independent critical care time 45 minutes  Baltazar Apo, MD, PhD Mar 29, 2018, 12:21 PM [facility] [phone] or if no answer [phone]

## 2018-04-10 NOTE — Progress Notes (Signed)
   04-16-2018 1425  Clinical Encounter Type  Visited With Patient and family together  Visit Type Initial;Spiritual support  Referral From Nurse  Consult/Referral To [place]  The chaplain responded to RN request for Pt. and family prayer.  The RN shared a terminal ween is scheduled for the Pt. this evening.  The chaplain was spiritually present with 3 daughters and extended family.  The chaplain prayed with family and offered F/U spiritual care as needed.

## 2018-04-10 NOTE — Progress Notes (Signed)
Fentanyl gtt 45 ml wasted with Barbaraann Rondo RN.

## 2018-04-10 NOTE — Progress Notes (Signed)
PULMONARY / CRITICAL CARE MEDICINE   NAME:  Glenda Jensen, MRN:  [id number], DOB:  November 12, 1950, LOS: 7 ADMISSION DATE:  03/27/2018, CONSULTATION DATE:  04/09/2018 REFERRING MD: hospitalist , CHIEF COMPLAINT: shortness of breath   BRIEF HISTORY:    68 year old lady with history of non-small cell lung cancer newly diagnosed with metastasis to the brain coming in with shortness of breath admitted to the hospitalist service now in severe respiratory distress transferred to the ICU for intubation. 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  I was called early this morning to come and assess her which I came to assess immediately on the floor. Daughter who is a hospice nurse was at bedside.  I am not able to get any history from the patient since her altered mental status but as per daughter patient was just started on some pain medicine for her recent thoracentesis and right chest pain when she thought that most of her breathing issues were secondary to that patient was getting confused at home no fever no chills no rigors.  Patient had a thoracentesis and of November which was showing atypical cells.  MRI of the brain showed right thalamic 2 x 2 centimeter mass.    SIGNIFICANT PAST MEDICAL HISTORY   -Newly diagnosed non-small cell lung cancer with metastasis to the brain -Hypertension -History of cholecystectomy  SIGNIFICANT EVENTS:  Endotracheal intubation 04/04/2018  STUDIES:   CT head 03/26/2018 1. Motion degraded study. Small area of hypo attenuation in  the posterior right temporal lobe may be related to motion artifact although a small acute to subacute area of ischemia cannot be excluded. MRI of the brain could be used to further evaluate as clinically warranted. 2. The mass in the right thalamus is again noted with the abnormal soft tissue extending into the posterior aspect of the right lateral Ventricle.  CULTURES:  03/16/2018 chest two-point>> 03/23/2018 blood culture>> 03/19/2018 sputum culture>>  ANTIBIOTICS:  Cefepime 03/16/2018 >> Vancomycin 03/11/2018>> 12/11 Flagyl 04/07/2018>>  LINES/TUBES:  ET tube 03/30/2018 >>  Right radial A-line 03/22/2018>>  right [place] pneumothorax chest tube 03/13/2018>>  CONSULTANTS:   SUBJECTIVE:  Norepinephrine started 12/14, weaning phenylephrine No significant change in PEEP and FiO2 needs Remains heavily sedated   CONSTITUTIONAL: BP (!) 103/54   Pulse (!) 126   Temp (!) 101.5 F (38.6 C) (Axillary)   Resp (!) 33   Ht 5\' 3"  (1.6 m)   Wt 81.1 kg   SpO2 96%   BMI 31.67 kg/m   I/O last 3 completed shifts: In: 4668.7 [P.O.:400; I.V.:3868.4; Other:20; NG/GT:165; IV Piggyback:215.3] Out: 1465 [Urine:165; Emesis/NG output:1300]     Vent Mode: PRVC FiO2 (%):  [40 %] 40 % Set Rate:  [33 bmp] 33 bmp Vt Set:  [310 mL] 310 mL PEEP:  [5 cmH20-8 cmH20] 5 cmH20 Plateau Pressure:  [23 cmH20-36 cmH20] 23 cmH20  PHYSICAL EXAM: General: Ill-appearing woman, obese, ventilated Neuro: Heavily sedated but some grimace with pain, otherwise unresponsive HEENT: ET tube in place, pupils equal, no oral lesions Cardiovascular: Regular, no murmur Lungs: Bilateral rhonchi, more so on  the right than on the left, no wheezing.  Right-sided chest tube in place and to suction Abdomen: Somewhat distended, soft bowel sounds Musculoskeletal: Trace lower extremity edema, right foot becoming dusky Skin: No rash   RESOLVED PROBLEM LIST   ASSESSMENT AND PLAN   Assessment:  Acute hypoxemic respiratory  failure with bilateral pulmonary infiltrates consistent with ARDS; likely aspiration pneumonitis especially with suppressed mental status preceding admission.  Some improvement chest x-ray 12/15, right lower lobe remains abnormal Remains on PEEP of 8, same FiO2.  I have not made much progress over the last several days.  Continues to require heavy sedation in order to saturate adequately Cefepime day 8 of 8 today Bronchodilators as needed Continue chest tube to -20 cm suction   Shock.  Septic shock but consider cardiogenic component, volume status, sedating medications.  Echocardiogram reassuring Transitioning phenylephrine over to norepinephrine. Following CVP  Large right pleural effusion, presumed malignant Continue right-sided chest tube to suction  Acute renal failure, likely ATN due to hypoperfusion, shock.  Interval worsening, serum creatinine 2.81 on 12/15 Progressive metabolic acidosis due to the above, lactate 2.8 Hyperkalemia Continue to follow urine output, BMP Deferring any diuresis at this time, ineffective on 12/13 She is a poor candidate for hemodialysis should she have overt renal failure.  I discussed with the family we will continue to do so.  Abdominal distention, emesis Ileus, tube feeding on hold no evidence of overt obstruction  Poorly differentiated non-small cell lung cancer with metastatic disease to brain, probably to the right pleura We can revisit treatment strategy if she can recover from this acute event but I believe that this is becoming less likely.  Hyponatremia.  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  Component of medications, hyponatremia.  No evidence for seizure activity noted Continue sedation with propofol, fentanyl Hopefully we can begin to lighten sedation going forward  Hypoglycemia Tube feeding on hold   SUMMARY OF TODAY'S PLAN:    Best Practice / Goals of Care  / Disposition.   DVT PROPHYLAXIS:Lovenox SUP:PPI  NUTRITION: Tube feeding MOBILITY:bedrest  GOALS OF CARE:full code  FAMILY DISCUSSIONS: Prognosis for recovery here is poor, especially recovery that would get a replacement she can tolerate therapy for her newly diagnosed lung cancer.  We are planning to discuss in detail on 12/15.  The family knows Dr. Clementeen Graham with oncology and he may be able to help [place] as well. DISPOSITION icu   LABS  Glucose Recent Labs  Lab 03/23/18 1124 03/23/18 1549 03/23/18 2011 03/23/18 2358 2018/04/01 0402 04/01/18 0803  GLUCAP 104* 147* 154* 128* 138* 129*    BMET Recent Labs  Lab 03/22/18 0334 03/23/18 0909 04/01/18 0415  NA 135 136 135  K 4.3 4.8 5.0  CL 98 103 99  CO2 23 19* 19*  BUN 54* 75* 92*  CREATININE 1.60* 2.40* 2.81*  GLUCOSE 149* 152* 153*    Liver Enzymes Recent Labs  Lab 04/03/2018 1532  AST 21  ALT 11  ALKPHOS 262*  BILITOT 0.6  ALBUMIN 2.6*    Electrolytes Recent Labs  Lab 03/19/18 0448 03/20/18 0434  03/22/18 0334 03/23/18 0909 04/01/2018 0415  CALCIUM 9.2 9.1   < > 9.1 8.6* 9.0  MG 1.8 2.2  --  2.3  --  2.3  PHOS 3.7 3.6  --   --   --   --    < > = values in this interval not displayed.  CBC Recent Labs  Lab 03/20/18 0434 03/21/18 0405 2018-03-26 0415  WBC 27.0* 30.8* 35.9*  HGB 12.7 12.9 11.3*  HCT 40.2 42.1 37.4  PLT 294 302 293    ABG Recent Labs  Lab 03/20/18 0400 03/21/18 1144 03-26-18 0450  PHART 7.291* 7.200* 7.256*  PCO2ART 55.9* 58.6* 46.0  PO2ART 133* 60.0* 79.5*    Coag's Recent Labs  Lab 04/04/2018 1532 03/20/2018 0716  INR 1.10 1.11    Sepsis Markers Recent Labs  Lab 03/16/2018 1532  03/11/2018 0716 03/19/18 1130 03/26/2018 0500  LATICACIDVEN  --    < > 2.8* 2.0* 2.8*  PROCALCITON 1.14  --   --   --   --    < > = values in this interval not displayed.    Cardiac Enzymes Recent Labs  Lab 04/08/2018 2005  TROPONINI <0.03     Independent critical care time 35  minutes  Baltazar Apo, MD, PhD 2018/03/26, 9:42 AM [facility] [phone] or if no answer [phone]

## 2018-04-10 NOTE — Procedures (Signed)
Extubation Procedure Note  Patient Details:   Name: Glenda Jensen DOB: [birth year] MRN: [id number]   Airway Documentation:    Vent end date: 04-19-18 Vent end time: 1800   Evaluation  Pt extubated to Room Air per Withdrawal of Life Protocol   Jesse Sans   04/19/18, 6:02 PM

## 2018-04-10 DEATH — deceased

## 2018-04-11 ENCOUNTER — Telehealth: Payer: Self-pay

## 2018-04-11 NOTE — Telephone Encounter (Signed)
On 04/11/2018 I received a dc from [facility] (original). DC is for cremation,. Patient is a patient of Doctor Byrum. DC will be taken to [place] for signature.

## 2018-04-12 DIAGNOSIS — J189 Pneumonia, unspecified organism: Secondary | ICD-10-CM | POA: Diagnosis present

## 2018-04-12 DIAGNOSIS — E872 Acidosis: Secondary | ICD-10-CM | POA: Diagnosis not present

## 2018-04-12 DIAGNOSIS — E162 Hypoglycemia, unspecified: Secondary | ICD-10-CM | POA: Diagnosis not present

## 2018-04-12 DIAGNOSIS — A419 Sepsis, unspecified organism: Secondary | ICD-10-CM | POA: Diagnosis present

## 2018-04-12 DIAGNOSIS — K567 Ileus, unspecified: Secondary | ICD-10-CM | POA: Diagnosis not present

## 2018-04-12 DIAGNOSIS — N179 Acute kidney failure, unspecified: Secondary | ICD-10-CM | POA: Diagnosis not present

## 2018-04-12 DIAGNOSIS — E8729 Other acidosis: Secondary | ICD-10-CM | POA: Diagnosis not present

## 2018-04-12 DIAGNOSIS — R6521 Severe sepsis with septic shock: Secondary | ICD-10-CM

## 2018-04-18 NOTE — Telephone Encounter (Signed)
Received original copy of D/C from [facility]-D/C forwarded to Dr.Byrum to be signed.

## 2018-04-24 NOTE — Telephone Encounter (Signed)
04/24/2018 Received D/C from Dr. Lamonte Sakai  I called [facility] and they will pick up today. PWR

## 2018-05-11 NOTE — Death Summary Note (Signed)
DEATH SUMMARY   Patient Details  Name: Glenda Jensen MRN: [id number] DOB: 04/01/1951  Admission/Discharge Information   Admit Date:  April 03, 2018  Date of Death: Date of Death: April 10, 2018  Time of Death: Time of Death: 1801-07-05  Length of Stay: 7  Referring Physician: Sinda Du, MD   Reason(s) for Hospitalization  Acute respiratory failure with hypoxemia  Diagnoses  Preliminary cause of death:   Acute respiratory failure due to ARDS and pneumonia  Secondary Diagnoses (including complications and co-morbidities):  Principal Problem:   Acute respiratory failure with hypoxia ([place]) Active Problems:   ARDS (adult respiratory distress syndrome) ([place])   Septic shock ([place])   Pneumonia   Acute renal failure (ARF) (HCC)   Non-small cell lung cancer (NSCLC) ([place])   Recurrent pleural effusion on right   Acute metabolic encephalopathy   High anion gap metabolic acidosis   Brain metastasis (H[facility])   Hyponatremia   Severe sepsis with acute organ dysfunction ([place])   Ileus ([place])   Hypoglycemia   Brief Hospital Course (including significant findings, care, treatment, and services provided and events leading to death)  Glenda Jensen is a 68 y.o. year old female with a history of hypertension who was newly diagnosed with right non-small cell lung cancer metastatic to the thalamus.  She was under evaluation but had not yet undergone treatment.  She was admitted 03-Apr-2018 with acute dyspnea in the setting of right greater than left pulmonary infiltrates and a large right pleural effusion.  She was treated for a presumed healthcare associated versus aspiration pneumonia.  She required BiPAP support and initially tolerated.  She underwent therapeutic right thoracentesis 12/9.  Post procedure she progressed to overt respiratory failure and obtundation.  She required intubation and mechanical ventilation on 12/9.  Antibiotics were broadened to cover her pneumonia and possible right empyema versus malignant  effusion.  Pressors were initiated as she was in shock post intubation.  Consistent with septic shock but also possible contribution sedating medications.  She had initially high sedation need in order to tolerate low tidal volume ventilation to treat ARDS given her bilateral pulmonary infiltrates.  Unfortunately she evolved a multifactorial encephalopathy, progressive acute renal failure with an anion gap metabolic acidosis, hyperkalemia, hyponatremia possibly due to SIADH.  Her metabolic disarray and renal failure raise the issue of possible dialysis.  Discussions were had with the patient's family by Dr. Lamonte Sakai and also Dr. Clementeen Graham with Oncology, who was a family friend.  It was explained that given her acute progressive problems and especially the evolving renal failure, superimposed on her malignancy which would be hard to treat or manage, that the chances for meaningful recovery were exceedingly small.  Based on this information and the patient's stated wishes regarding her medical care decision was made to transition off of mechanical ventilation and to focus on comfort.  This was done on 04-10-2018.  The patient expired later that same day with family present.    Pertinent Labs and Studies  Significant Diagnostic Studies Dg Chest 1 View  Result Date: 03/13/2018 CLINICAL DATA:  Right chest tube placement EXAM: CHEST  1 VIEW COMPARISON:  04/03/2018 FINDINGS: Interval placement of pigtail chest tube in the right lung base in good position. No change in moderate to large right effusion. Diffuse bilateral airspace disease remains unchanged. No pneumothorax. Endotracheal tube just above the carina 1 cm. NG tube coiled in the stomach. IMPRESSION: Satisfactory right chest tube placement. No change in moderate to large right effusion. No pneumothorax Endotracheal tube just  above the carina Diffuse bilateral airspace disease unchanged. Electronically Signed   By: Franchot Gallo M.D.   On: 04/06/2018 15:38   Dg  Abd 1 View  Result Date: 03/13/2018 CLINICAL DATA:  Assess orogastric tube position. EXAM: ABDOMEN - 1 VIEW COMPARISON:  Chest x-ray of March 05, 2018 FINDINGS: There are gastric tube proximal port and tip project in the lumen of the stomach distally. The bowel gas pattern is normal. There surgical clips in the gallbladder fossa. There is mild dextrocurvature of the lumbar spine with a rotatory component. IMPRESSION: Reasonable positioning of the orogastric tube in the mid to distal stomach. Electronically Signed   By: David  Martinique M.D.   On: 03/13/2018 09:21   Ct Head Wo Contrast  Result Date: 04/07/2018 CLINICAL DATA:  Altered mental status. EXAM: CT HEAD WITHOUT CONTRAST TECHNIQUE: Contiguous axial images were obtained from the base of the skull through the vertex without intravenous contrast. COMPARISON:  Brain MRI 02/21/2018. FINDINGS: Brain: Focal area of decreased attenuation identified in the right posterior temporal lobe (axial image 16/series 2 and coronal image 43/series 4. There is fairly substantial motion artifact on this exam and features may be related to artifact but a small focus of acute to subacute ischemia is not excluded. The right down this lesion is again identified with abnormal soft tissue in the posterior aspect of the right lateral ventricle. No hydrocephalus. No evidence for an acute hemorrhage. No abnormal extra-axial fluid collection. Stable nonacute left cerebellar infarct. Vascular: No hyperdense vessel or unexpected calcification. Skull: No evidence for fracture. No worrisome lytic or sclerotic lesion. Sinuses/Orbits: The visualized paranasal sinuses and mastoid air cells are clear. Visualized portions of the globes and intraorbital fat are unremarkable. Other: None. IMPRESSION: 1. Motion degraded study. Small area of hypo attenuation in the posterior right temporal lobe may be related to motion artifact although a small acute to subacute area of ischemia cannot be  excluded. MRI of the brain could be used to further evaluate as clinically warranted. 2. The mass in the right thalamus is again noted with the abnormal soft tissue extending into the posterior aspect of the right lateral ventricle. Electronically Signed   By: Misty Stanley M.D.   On: 03/21/2018 18:04   Dg Chest Port 1 View  Result Date: 18-Apr-2018 CLINICAL DATA:  Acute respiratory failure with hypoxia. EXAM: PORTABLE CHEST 1 VIEW COMPARISON:  03/22/2018. FINDINGS: Stable cardiomediastinal silhouette. ETT 2.7 cm above carina. BILATERAL pulmonary opacities are improved, with significant opacity remaining at the RIGHT base. Pigtail catheter remains appropriately positioned. Decreased RIGHT pleural effusion as well. No pneumothorax. Orogastric tube coiled in stomach. IMPRESSION: Improved aeration with significant opacity remaining base. Support tubes and lines stable. Electronically Signed   By: Staci Righter M.D.   On: Apr 18, 2018 07:16   Dg Chest Port 1 View  Result Date: 03/22/2018 CLINICAL DATA:  Acute respiratory failure with hypoxia. EXAM: PORTABLE CHEST 1 VIEW COMPARISON:  Yesterday FINDINGS: Endotracheal tube tip 17 mm above the carina. An orogastric tube at least reaches the stomach. Chest tube on the right. There is stable if not increased right pleural fluid. Diffuse interstitial and airspace opacity, particularly dense on the right. Stable heart size. IMPRESSION: 1. Stable hardware positioning. 2. Possible increase complex pleural effusion on the right. 3. History of ARDS with unchanged extensive opacity. Electronically Signed   By: Monte Fantasia M.D.   On: 03/22/2018 07:33   Dg Chest Port 1 View  Result Date: 03/21/2018 CLINICAL DATA:  Acute  respiratory failure with hypoxia EXAM: PORTABLE CHEST 1 VIEW COMPARISON:  Yesterday FINDINGS: Endotracheal tube tip is 12 mm above the carina. The orogastric tube reaches the stomach. Pigtail pleural catheter on the right with stable loculated lateral  pleural effusion. There is in generalized interstitial opacity. No pneumothorax. IMPRESSION: 1. Stable hardware positioning. 2. Unchanged interstitial and airspace opacity with residual loculated pleural fluid on the right Electronically Signed   By: Monte Fantasia M.D.   On: 03/21/2018 07:49   Dg Chest Port 1 View  Result Date: 03/20/2018 CLINICAL DATA:  Respiratory failure. EXAM: PORTABLE CHEST 1 VIEW COMPARISON:  Radiograph of March 19, 2018. FINDINGS: Stable cardiomediastinal silhouette. Endotracheal and nasogastric tubes are unchanged in position. No pneumothorax is noted. Stable position of right pleural drainage catheter is noted. Pleural effusion is unchanged. Left basilar opacity is mildly improved. Stable large right basilar airspace opacity is noted concerning for pneumonia. Bony thorax is unremarkable. IMPRESSION: Stable right lower lobe pneumonia and effusion is noted pleural drainage catheter is unchanged in position. Stable support apparatus. Mildly improved left basilar opacity. Electronically Signed   By: Marijo Conception, M.D.   On: 03/20/2018 07:28   Dg Chest Port 1 View  Result Date: 03/19/2018 CLINICAL DATA:  Acute respiratory failure with hypoxia. EXAM: PORTABLE CHEST 1 VIEW COMPARISON:  Radiograph March 18, 2018. FINDINGS: Stable cardiomegaly. Endotracheal and nasogastric tubes are unchanged in position. No pneumothorax is noted. Stable position of right-sided pleural drainage catheter. Pleural effusion appears to be slightly improved compared to prior exam. Stable large bilateral airspace opacities are noted, right greater than left concerning for pneumonia or possibly edema. Bony thorax is unremarkable. IMPRESSION: Stable support apparatus. Stable bilateral lung opacities are noted concerning for pneumonia or possibly edema. Slightly improved right pleural effusion. Electronically Signed   By: Marijo Conception, M.D.   On: 03/19/2018 07:20   Portable Chest X-ray  Result  Date: 03/13/2018 CLINICAL DATA:  Endotracheal tube placement. Metastatic lung cancer. EXAM: PORTABLE CHEST 1 VIEW COMPARISON:  Radiographs 03/05/2018 and 03/28/2018. FINDINGS: 0715 hours. Interval intubation. Tip of the endotracheal tube is 1.5 cm above the carina. There is stable cardiomegaly. There is a stable moderate to large right pleural effusion. There is subtotal collapse of the right middle and lower lobes with associated air bronchograms. There are progressive interstitial and airspace opacities throughout the aerated lungs. No pneumothorax or acute osseous findings evident. IMPRESSION: 1. Endotracheal tube tip is 1.5 cm above the carina. This could be retracted by 2-3 cm for more optimal positioning. 2. Progressive airspace opacities throughout the visualized lungs, most likely edema/ARDS superimposed on underlying known malignant right pleural effusion. Electronically Signed   By: Richardean Sale M.D.   On: 03/22/2018 08:35   Dg Chest Portable 1 View  Result Date: 03/15/2018 CLINICAL DATA:  Respiratory some distress.  Right lung cancer. EXAM: PORTABLE CHEST 1 VIEW COMPARISON:  03/05/2018 FINDINGS: Right pleural effusion persists, moderate to large in size. There is diffuse interstitial and basilar airspace disease bilaterally with some obscuration by motion artifact. No left pleural effusion. The cardio pericardial silhouette is enlarged. The visualized bony structures of the thorax are intact. IMPRESSION: 1. Cardiomegaly with moderate to large right pleural effusion. 2. Diffuse interstitial and basilar predominant airspace opacity. Pulmonary edema a distinct consideration although diffuse infection not excluded. Electronically Signed   By: Misty Stanley M.D.   On: 03/15/2018 16:02   Dg Abd Portable 1v  Result Date: 03/22/2018 CLINICAL DATA:  Abdominal distension and tightness.  EXAM: PORTABLE ABDOMEN - 1 VIEW COMPARISON:  March 18, 2018 FINDINGS: A new right femoral line is identified. The  enteric tube terminates in the left side of the abdomen, likely in the distal stomach. There is a paucity of bowel gas limiting further evaluation but no convincing evidence of bowel obstruction. IMPRESSION: 1. Support apparatus as above. 2. There is a paucity of bowel gas limiting evaluation but no other abnormalities are seen. Electronically Signed   By: Dorise Bullion III M.D   On: 03/22/2018 12:42     Procedures/Operations  Right thoracentesis 12/9 ET tube 12/9 Right small bore chest tube 12/9 R femoral CVC 12/9 R femoral art line 12/9   Baltazar Apo, MD, PhD 04/12/2018, 2:51 PM [facility] [phone] or if no answer [phone]

## 2019-03-27 IMAGING — DX DG CHEST 1V PORT
1 series · 1 of 1 positions shown · non-contrast
Comparison: Yesterday

CLINICAL DATA: Acute respiratory failure with hypoxia

EXAM:
PORTABLE CHEST 1 VIEW

[chest ap]
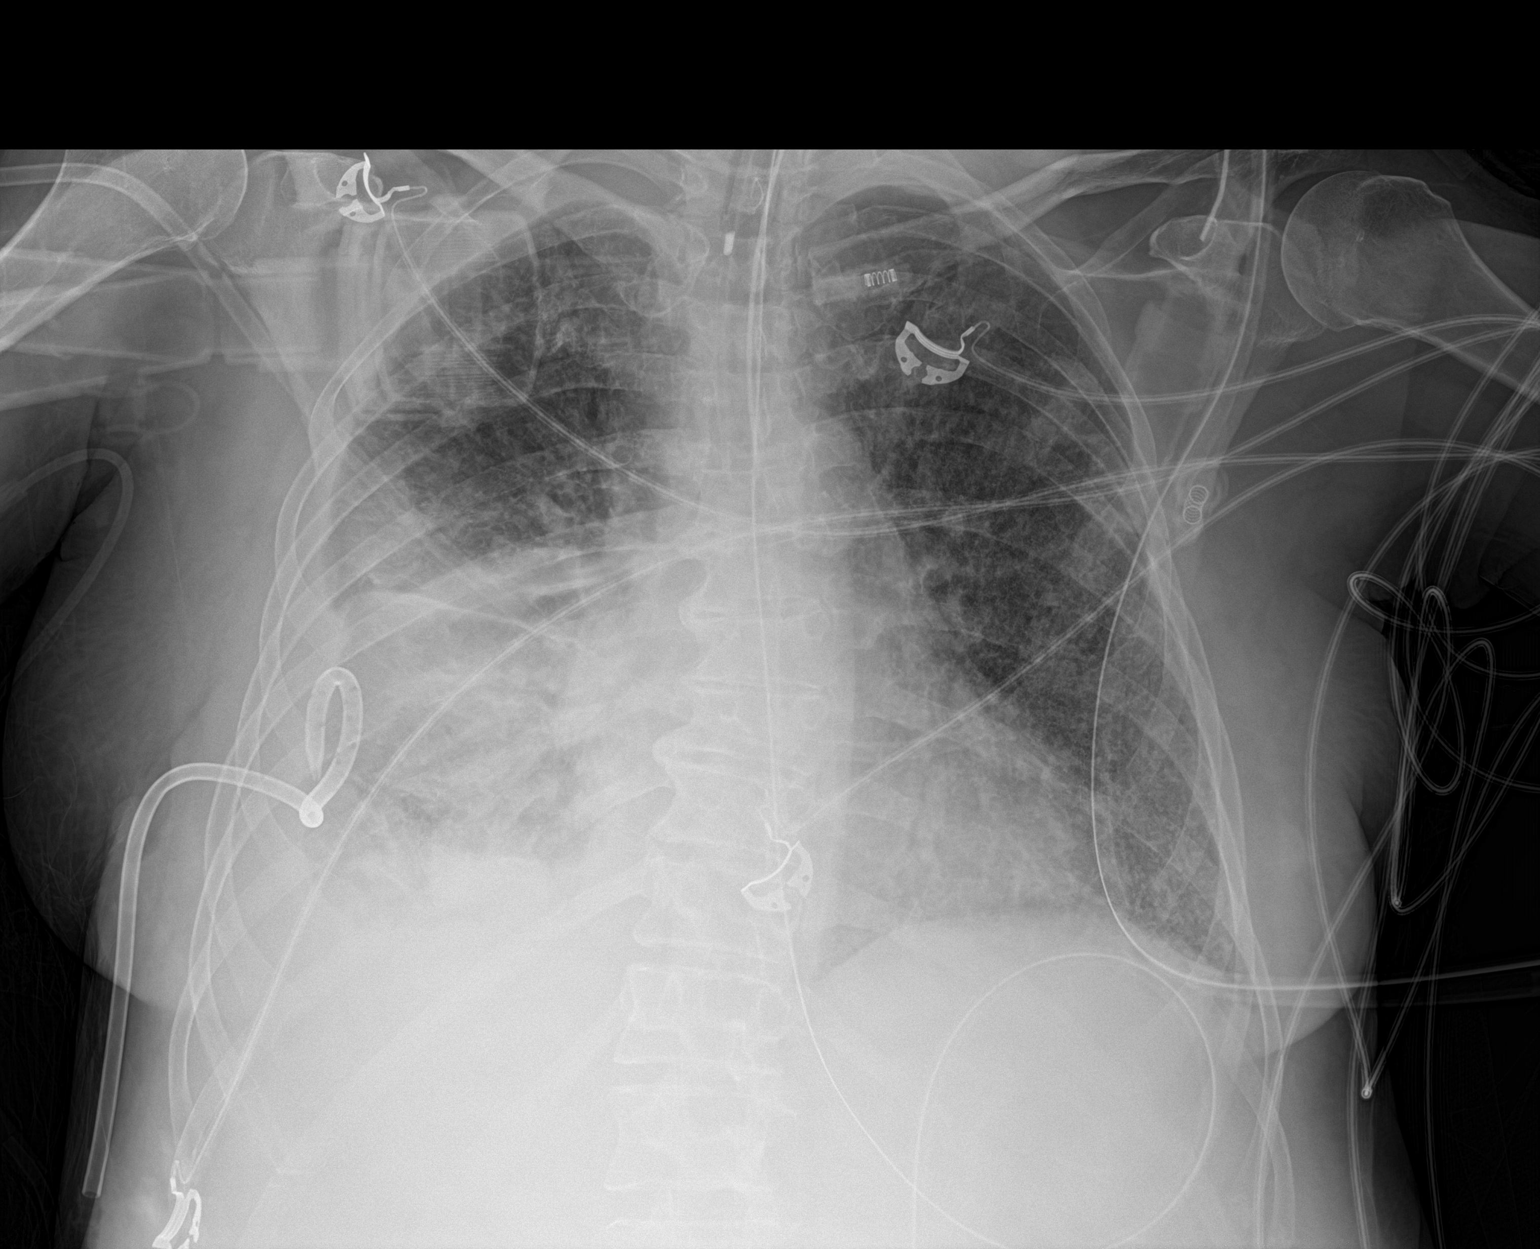

[1 of 1 positions shown; findings below may reference images not displayed]

FINDINGS: Endotracheal tube tip is 12 mm above the carina. The orogastric tube
reaches the stomach. Pigtail pleural catheter on the right with
stable loculated lateral pleural effusion. There is in generalized
interstitial opacity. No pneumothorax.
IMPRESSION: 1. Stable hardware positioning.
2. Unchanged interstitial and airspace opacity with residual
loculated pleural fluid on the right

## 2019-03-28 IMAGING — DX DG ABD PORTABLE 1V
2 series · 2 of 2 positions shown · non-contrast
Comparison: March 18, 2018

CLINICAL DATA: Abdominal distension and tightness.

EXAM:
PORTABLE ABDOMEN - 1 VIEW

[abdomen kub (1 of 2)]
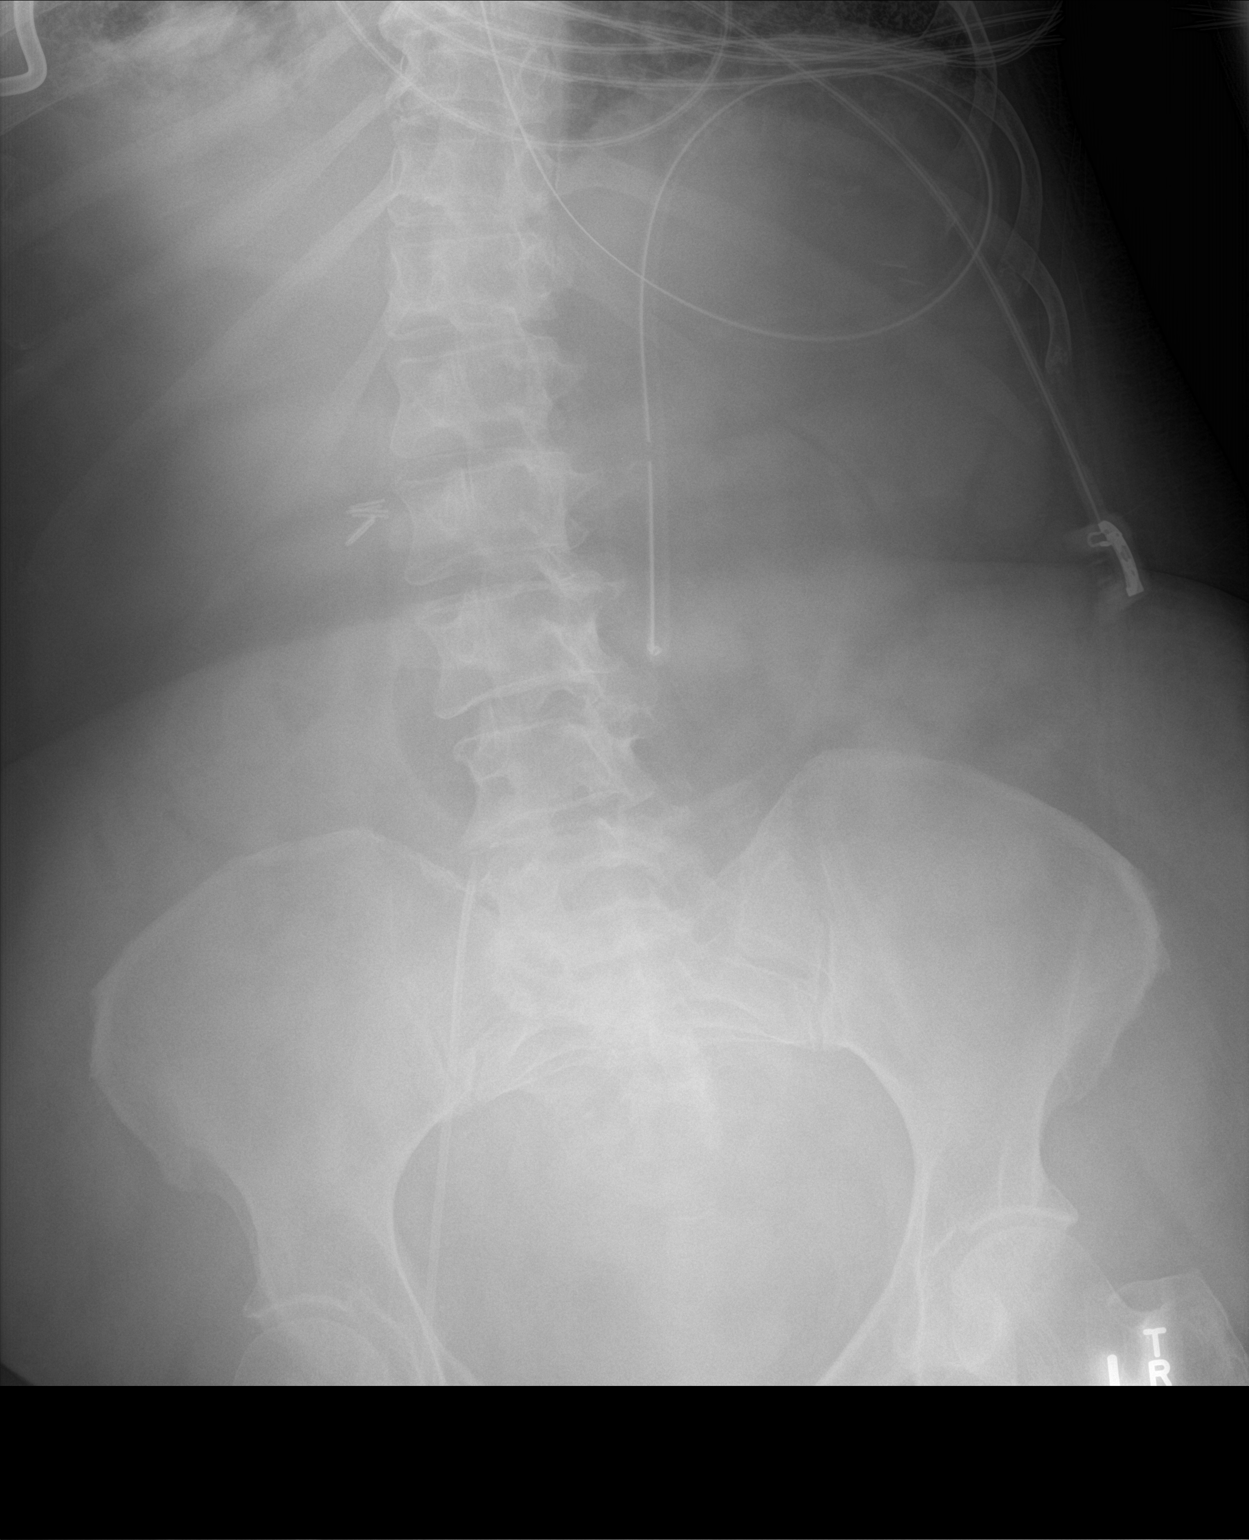

[abdomen kub (2 of 2)]
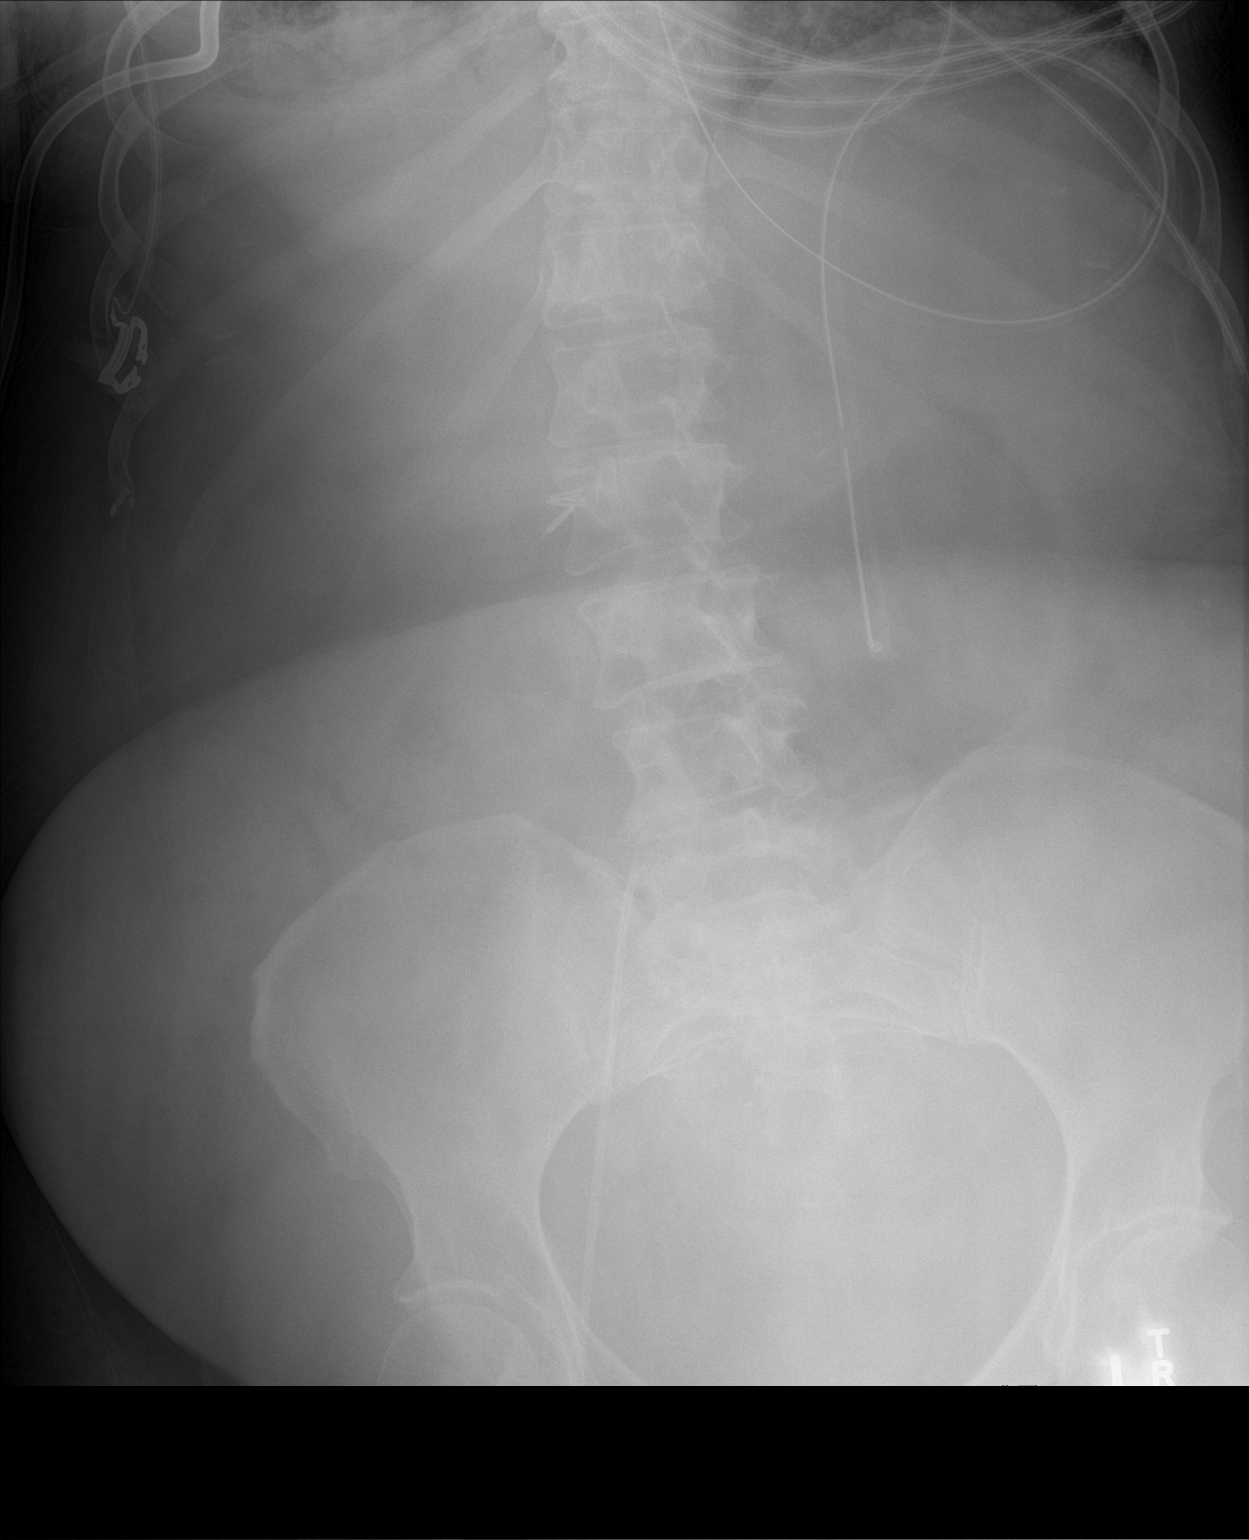

[2 of 2 positions shown; findings below may reference images not displayed]

FINDINGS: A new right femoral line is identified. The enteric tube terminates
in the left side of the abdomen, likely in the distal stomach. There
is a paucity of bowel gas limiting further evaluation but no
convincing evidence of bowel obstruction.
IMPRESSION: 1. Support apparatus as above.
2. There is a paucity of bowel gas limiting evaluation but no other
abnormalities are seen.

## 2019-03-28 IMAGING — DX DG CHEST 1V PORT
1 series · 1 of 1 positions shown · non-contrast
Comparison: Yesterday

CLINICAL DATA: Acute respiratory failure with hypoxia.

EXAM:
PORTABLE CHEST 1 VIEW

[chest]
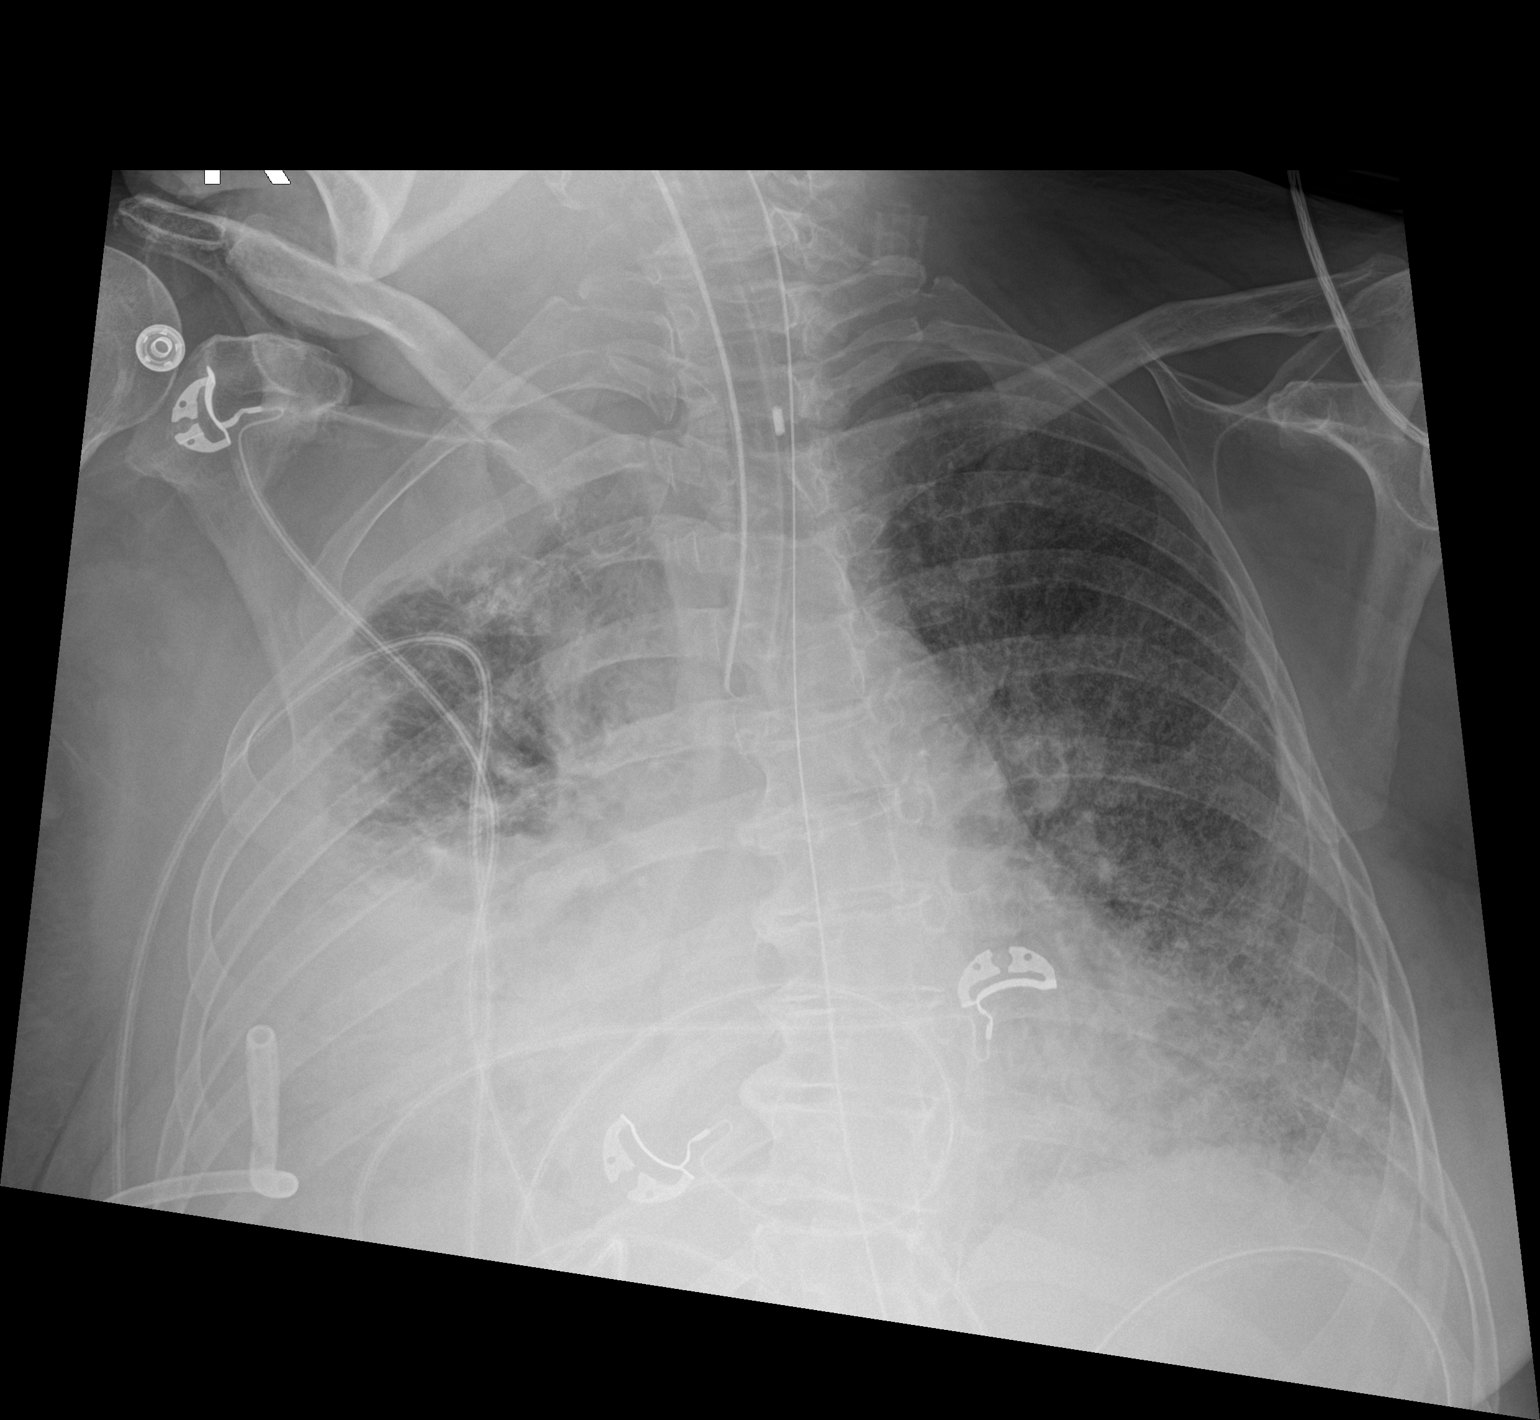

[1 of 1 positions shown; findings below may reference images not displayed]

FINDINGS: Endotracheal tube tip 17 mm above the carina. An orogastric tube at
least reaches the stomach.

Chest tube on the right. There is stable if not increased right
pleural fluid. Diffuse interstitial and airspace opacity,
particularly dense on the right. Stable heart size.
IMPRESSION: 1. Stable hardware positioning.
2. Possible increase complex pleural effusion on the right.
3. History of ARDS with unchanged extensive opacity.

## 2019-03-30 IMAGING — DX DG CHEST 1V PORT
1 series · 1 of 1 positions shown · non-contrast
Comparison: 03/22/2018.

CLINICAL DATA: Acute respiratory failure with hypoxia.

EXAM:
PORTABLE CHEST 1 VIEW

[chest]
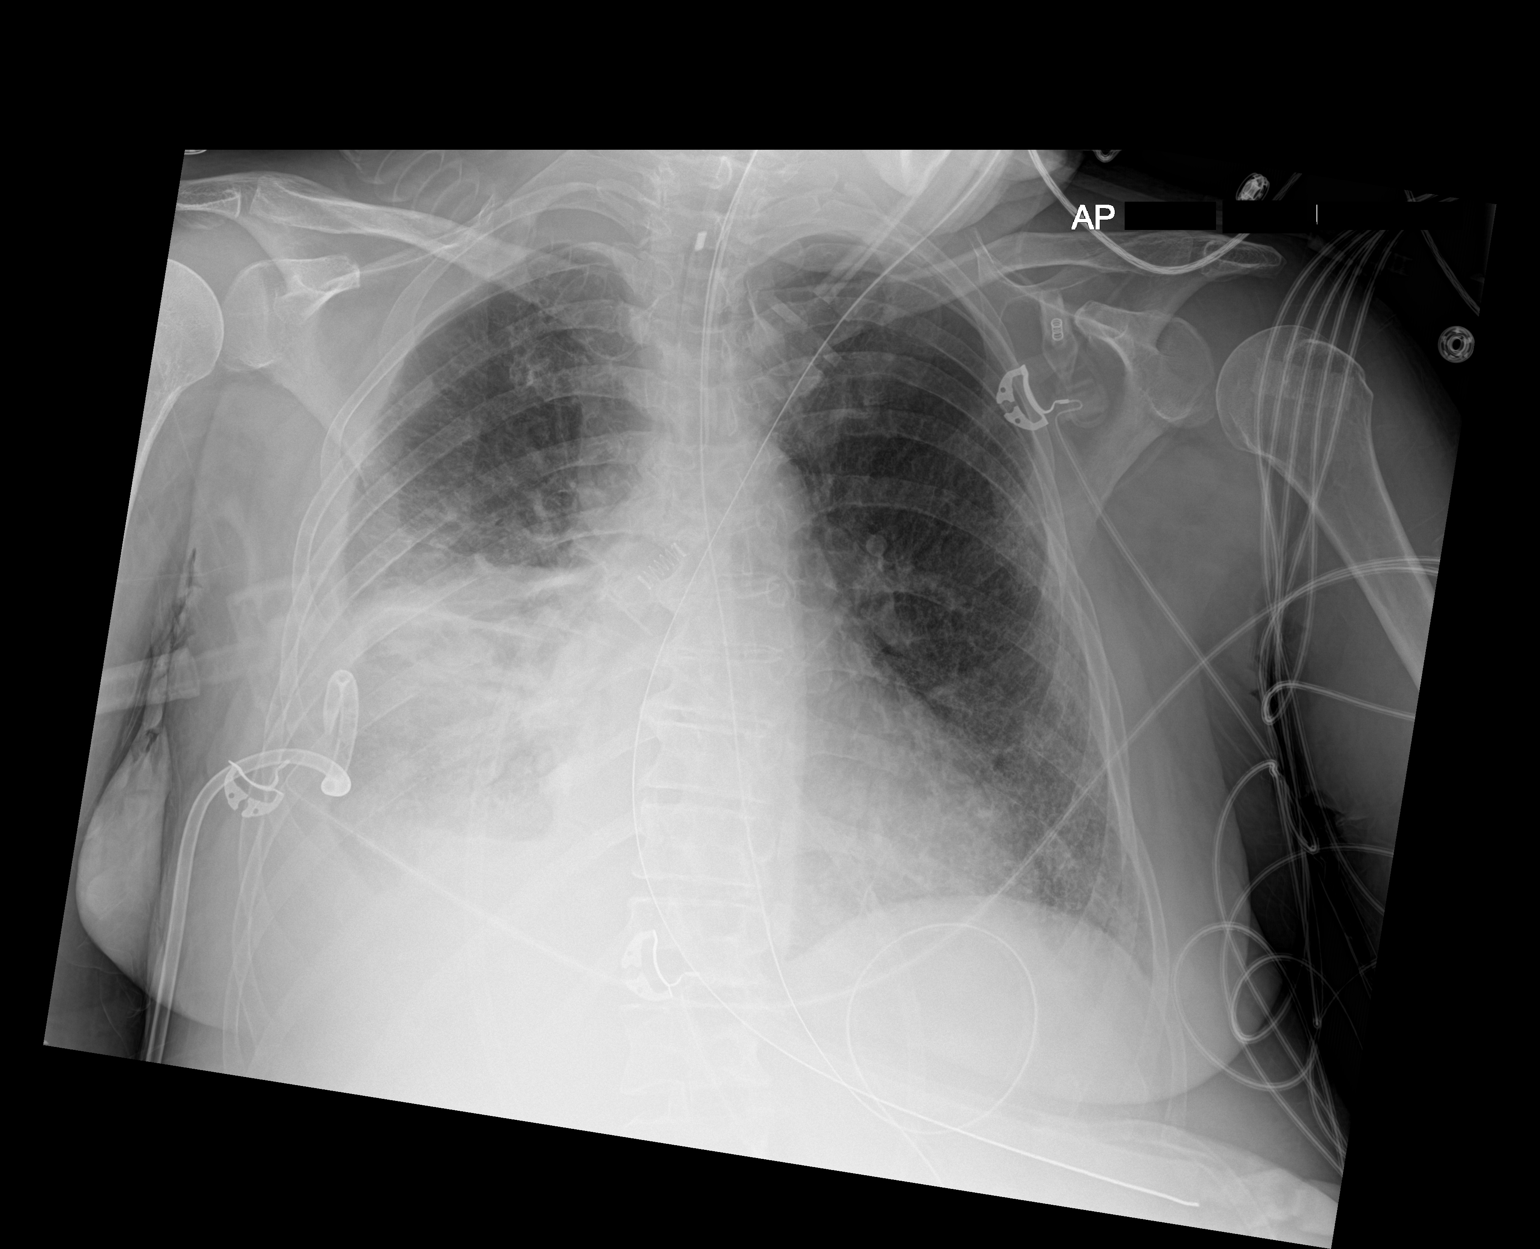

[1 of 1 positions shown; findings below may reference images not displayed]

FINDINGS: Stable cardiomediastinal silhouette. ETT 2.7 cm above carina.
BILATERAL pulmonary opacities are improved, with significant opacity
remaining at the RIGHT base. Pigtail catheter remains appropriately
positioned. Decreased RIGHT pleural effusion as well. No
pneumothorax. Orogastric tube coiled in stomach.
IMPRESSION: Improved aeration with significant opacity remaining base. Support
tubes and lines stable.

## 2020-02-19 NOTE — Telephone Encounter (Signed)
Created in error. Provider no longer with [facility].
